# Patient Record
Sex: Female | Born: 1935 | Race: Black or African American | Hispanic: No | Marital: Married | Smoking: Never smoker
Health system: Southern US, Community
[De-identification: ages and names within clinical notes are randomized; demographics above are authoritative.]

## PROBLEM LIST (undated history)

## (undated) DIAGNOSIS — E785 Hyperlipidemia, unspecified: Secondary | ICD-10-CM

## (undated) DIAGNOSIS — K219 Gastro-esophageal reflux disease without esophagitis: Secondary | ICD-10-CM

## (undated) DIAGNOSIS — I10 Essential (primary) hypertension: Secondary | ICD-10-CM

## (undated) DIAGNOSIS — M1990 Unspecified osteoarthritis, unspecified site: Secondary | ICD-10-CM

## (undated) DIAGNOSIS — G4730 Sleep apnea, unspecified: Secondary | ICD-10-CM

## (undated) HISTORY — PX: TOTAL KNEE ARTHROPLASTY: SHX125

## (undated) HISTORY — PX: TONSILLECTOMY: SUR1361

## (undated) HISTORY — PX: EYE SURGERY: SHX253

## (undated) HISTORY — DX: Unspecified osteoarthritis, unspecified site: M19.90

## (undated) HISTORY — DX: Hyperlipidemia, unspecified: E78.5

## (undated) HISTORY — DX: Essential (primary) hypertension: I10

---

## 1999-01-30 ENCOUNTER — Other Ambulatory Visit: Admission: RE | Admit: 1999-01-30 | Discharge: 1999-01-30 | Payer: Self-pay | Admitting: Family Medicine

## 1999-03-21 ENCOUNTER — Encounter: Payer: Self-pay | Admitting: Family Medicine

## 1999-03-21 ENCOUNTER — Ambulatory Visit (HOSPITAL_COMMUNITY): Admission: RE | Admit: 1999-03-21 | Discharge: 1999-03-21 | Payer: Self-pay | Admitting: Family Medicine

## 1999-04-17 ENCOUNTER — Ambulatory Visit (HOSPITAL_COMMUNITY): Admission: RE | Admit: 1999-04-17 | Discharge: 1999-04-17 | Payer: Self-pay | Admitting: Gastroenterology

## 1999-04-18 ENCOUNTER — Ambulatory Visit (HOSPITAL_COMMUNITY): Admission: RE | Admit: 1999-04-18 | Discharge: 1999-04-18 | Payer: Self-pay | Admitting: Gastroenterology

## 2000-04-03 ENCOUNTER — Other Ambulatory Visit: Admission: RE | Admit: 2000-04-03 | Discharge: 2000-04-03 | Payer: Self-pay | Admitting: Family Medicine

## 2000-08-06 ENCOUNTER — Ambulatory Visit (HOSPITAL_COMMUNITY): Admission: RE | Admit: 2000-08-06 | Discharge: 2000-08-06 | Payer: Self-pay | Admitting: Gastroenterology

## 2001-03-06 ENCOUNTER — Encounter: Payer: Self-pay | Admitting: Family Medicine

## 2001-03-06 ENCOUNTER — Ambulatory Visit (HOSPITAL_COMMUNITY): Admission: RE | Admit: 2001-03-06 | Discharge: 2001-03-06 | Payer: Self-pay | Admitting: Family Medicine

## 2001-04-23 ENCOUNTER — Encounter: Payer: Self-pay | Admitting: Family Medicine

## 2001-04-23 ENCOUNTER — Ambulatory Visit (HOSPITAL_COMMUNITY): Admission: RE | Admit: 2001-04-23 | Discharge: 2001-04-23 | Payer: Self-pay | Admitting: Family Medicine

## 2002-03-11 ENCOUNTER — Ambulatory Visit (HOSPITAL_COMMUNITY): Admission: RE | Admit: 2002-03-11 | Discharge: 2002-03-11 | Payer: Self-pay | Admitting: Family Medicine

## 2002-03-11 ENCOUNTER — Encounter: Payer: Self-pay | Admitting: Family Medicine

## 2002-07-14 ENCOUNTER — Other Ambulatory Visit: Admission: RE | Admit: 2002-07-14 | Discharge: 2002-07-14 | Payer: Self-pay | Admitting: Family Medicine

## 2003-08-17 ENCOUNTER — Ambulatory Visit (HOSPITAL_COMMUNITY): Admission: RE | Admit: 2003-08-17 | Discharge: 2003-08-17 | Payer: Self-pay | Admitting: Family Medicine

## 2003-08-17 ENCOUNTER — Encounter: Payer: Self-pay | Admitting: Family Medicine

## 2003-08-17 ENCOUNTER — Other Ambulatory Visit: Admission: RE | Admit: 2003-08-17 | Discharge: 2003-08-17 | Payer: Self-pay | Admitting: Family Medicine

## 2003-08-23 ENCOUNTER — Encounter: Payer: Self-pay | Admitting: Family Medicine

## 2003-08-23 ENCOUNTER — Ambulatory Visit (HOSPITAL_COMMUNITY): Admission: RE | Admit: 2003-08-23 | Discharge: 2003-08-23 | Payer: Self-pay | Admitting: Family Medicine

## 2004-06-14 ENCOUNTER — Ambulatory Visit (HOSPITAL_COMMUNITY): Admission: RE | Admit: 2004-06-14 | Discharge: 2004-06-14 | Payer: Self-pay | Admitting: Family Medicine

## 2004-06-18 ENCOUNTER — Ambulatory Visit (HOSPITAL_COMMUNITY): Admission: RE | Admit: 2004-06-18 | Discharge: 2004-06-18 | Payer: Self-pay | Admitting: Orthopedic Surgery

## 2004-07-04 ENCOUNTER — Ambulatory Visit (HOSPITAL_COMMUNITY): Admission: RE | Admit: 2004-07-04 | Discharge: 2004-07-04 | Payer: Self-pay | Admitting: Orthopedic Surgery

## 2004-07-04 ENCOUNTER — Ambulatory Visit (HOSPITAL_BASED_OUTPATIENT_CLINIC_OR_DEPARTMENT_OTHER): Admission: RE | Admit: 2004-07-04 | Discharge: 2004-07-04 | Payer: Self-pay | Admitting: Orthopedic Surgery

## 2004-07-04 ENCOUNTER — Encounter (INDEPENDENT_AMBULATORY_CARE_PROVIDER_SITE_OTHER): Payer: Self-pay | Admitting: *Deleted

## 2004-08-31 ENCOUNTER — Ambulatory Visit (HOSPITAL_COMMUNITY): Admission: RE | Admit: 2004-08-31 | Discharge: 2004-08-31 | Payer: Self-pay | Admitting: Family Medicine

## 2005-07-16 ENCOUNTER — Other Ambulatory Visit: Admission: RE | Admit: 2005-07-16 | Discharge: 2005-07-16 | Payer: Self-pay | Admitting: Family Medicine

## 2007-01-27 ENCOUNTER — Ambulatory Visit (HOSPITAL_COMMUNITY): Admission: RE | Admit: 2007-01-27 | Discharge: 2007-01-27 | Payer: Self-pay | Admitting: Family Medicine

## 2007-05-15 ENCOUNTER — Ambulatory Visit (HOSPITAL_COMMUNITY): Admission: RE | Admit: 2007-05-15 | Discharge: 2007-05-15 | Payer: Self-pay | Admitting: Family Medicine

## 2007-05-27 ENCOUNTER — Ambulatory Visit (HOSPITAL_COMMUNITY): Admission: RE | Admit: 2007-05-27 | Discharge: 2007-05-27 | Payer: Self-pay | Admitting: Orthopedic Surgery

## 2007-05-27 ENCOUNTER — Ambulatory Visit (HOSPITAL_COMMUNITY): Admission: RE | Admit: 2007-05-27 | Discharge: 2007-05-27 | Payer: Self-pay | Admitting: Family Medicine

## 2007-05-29 ENCOUNTER — Ambulatory Visit (HOSPITAL_COMMUNITY): Admission: RE | Admit: 2007-05-29 | Discharge: 2007-05-29 | Payer: Self-pay | Admitting: Family Medicine

## 2008-06-23 ENCOUNTER — Ambulatory Visit (HOSPITAL_COMMUNITY): Admission: RE | Admit: 2008-06-23 | Discharge: 2008-06-23 | Payer: Self-pay | Admitting: Family Medicine

## 2009-02-06 ENCOUNTER — Encounter: Admission: RE | Admit: 2009-02-06 | Discharge: 2009-02-06 | Payer: Self-pay | Admitting: Orthopedic Surgery

## 2009-06-29 ENCOUNTER — Ambulatory Visit (HOSPITAL_COMMUNITY): Admission: RE | Admit: 2009-06-29 | Discharge: 2009-06-29 | Payer: Self-pay | Admitting: Family Medicine

## 2009-07-04 ENCOUNTER — Other Ambulatory Visit: Admission: RE | Admit: 2009-07-04 | Discharge: 2009-07-04 | Payer: Self-pay | Admitting: Family Medicine

## 2009-10-17 ENCOUNTER — Encounter: Admission: RE | Admit: 2009-10-17 | Discharge: 2009-10-17 | Payer: Self-pay | Admitting: Orthopedic Surgery

## 2010-01-23 ENCOUNTER — Inpatient Hospital Stay (HOSPITAL_COMMUNITY): Admission: RE | Admit: 2010-01-23 | Discharge: 2010-01-27 | Payer: Self-pay | Admitting: Orthopedic Surgery

## 2010-02-20 ENCOUNTER — Encounter (HOSPITAL_COMMUNITY): Admission: RE | Admit: 2010-02-20 | Discharge: 2010-03-22 | Payer: Self-pay | Admitting: Orthopedic Surgery

## 2010-03-28 ENCOUNTER — Encounter: Admission: RE | Admit: 2010-03-28 | Discharge: 2010-03-28 | Payer: Self-pay | Admitting: Orthopedic Surgery

## 2010-06-08 ENCOUNTER — Ambulatory Visit (HOSPITAL_COMMUNITY): Admission: RE | Admit: 2010-06-08 | Discharge: 2010-06-08 | Payer: Self-pay | Admitting: Family Medicine

## 2010-06-15 ENCOUNTER — Ambulatory Visit (HOSPITAL_COMMUNITY): Admission: RE | Admit: 2010-06-15 | Discharge: 2010-06-15 | Payer: Self-pay | Admitting: Family Medicine

## 2010-07-18 ENCOUNTER — Ambulatory Visit: Admission: RE | Admit: 2010-07-18 | Discharge: 2010-07-18 | Payer: Self-pay | Admitting: Gynecologic Oncology

## 2010-07-31 ENCOUNTER — Inpatient Hospital Stay (HOSPITAL_COMMUNITY): Admission: RE | Admit: 2010-07-31 | Discharge: 2010-08-01 | Payer: Self-pay | Admitting: Obstetrics & Gynecology

## 2010-07-31 ENCOUNTER — Encounter: Payer: Self-pay | Admitting: Obstetrics & Gynecology

## 2010-07-31 HISTORY — PX: ABDOMINAL HYSTERECTOMY: SHX81

## 2010-09-06 ENCOUNTER — Ambulatory Visit: Admission: RE | Admit: 2010-09-06 | Discharge: 2010-09-06 | Payer: Self-pay | Admitting: Gynecologic Oncology

## 2010-09-17 ENCOUNTER — Ambulatory Visit (HOSPITAL_COMMUNITY): Admission: RE | Admit: 2010-09-17 | Discharge: 2010-09-17 | Payer: Self-pay | Admitting: *Deleted

## 2010-12-09 ENCOUNTER — Encounter: Payer: Self-pay | Admitting: Family Medicine

## 2011-01-23 ENCOUNTER — Ambulatory Visit
Admission: RE | Admit: 2011-01-23 | Discharge: 2011-01-23 | Disposition: A | Discharge status: Home / Self Care | Payer: Medicare Other | Source: Ambulatory Visit | Attending: Orthopedic Surgery | Admitting: Orthopedic Surgery

## 2011-01-23 ENCOUNTER — Other Ambulatory Visit: Payer: Self-pay | Admitting: Orthopedic Surgery

## 2011-01-23 DIAGNOSIS — R52 Pain, unspecified: Secondary | ICD-10-CM

## 2011-01-31 LAB — COMPREHENSIVE METABOLIC PANEL
AST: 19 U/L (ref 0–37)
Albumin: 3.8 g/dL (ref 3.5–5.2)
Calcium: 9.5 mg/dL (ref 8.4–10.5)
Creatinine, Ser: 0.99 mg/dL (ref 0.4–1.2)
GFR calc Af Amer: >60 mL/min (ref >60)
Total Protein: 7 g/dL (ref 6.0–8.3)

## 2011-01-31 LAB — CBC
MCH: 33.1 pg (ref 26.0–34.0)
MCH: 33.2 pg (ref 26.0–34.0)
MCHC: 34.5 g/dL (ref 30.0–36.0)
Platelets: 179 10*3/uL (ref 150–400)
Platelets: 197 10*3/uL (ref 150–400)
RBC: 3.66 MIL/uL — ABNORMAL LOW (ref 3.87–5.11)

## 2011-01-31 LAB — DIFFERENTIAL
Eosinophils Relative: 0 % (ref 0–5)
Lymphocytes Relative: 18 % (ref 12–46)
Lymphs Abs: 1.4 10*3/uL (ref 0.7–4.0)
Monocytes Absolute: 0.5 10*3/uL (ref 0.1–1.0)

## 2011-01-31 LAB — SURGICAL PCR SCREEN: Staphylococcus aureus: NEGATIVE

## 2011-01-31 LAB — ABO/RH: ABO/RH(D): A POS

## 2011-01-31 LAB — TYPE AND SCREEN: ABO/RH(D): A POS

## 2011-02-10 LAB — URINALYSIS, ROUTINE W REFLEX MICROSCOPIC
Glucose, UA: NEGATIVE mg/dL
Hgb urine dipstick: NEGATIVE
Specific Gravity, Urine: 1.013 (ref 1.005–1.030)
pH: 6 (ref 5.0–8.0)

## 2011-02-10 LAB — CBC
HCT: 30 % — ABNORMAL LOW (ref 36.0–46.0)
HCT: 42.1 % (ref 36.0–46.0)
MCHC: 34.3 g/dL (ref 30.0–36.0)
MCHC: 34.6 g/dL (ref 30.0–36.0)
MCV: 96.1 fL (ref 78.0–100.0)
Platelets: 104 10*3/uL — ABNORMAL LOW (ref 150–400)
Platelets: 215 10*3/uL (ref 150–400)
RDW: 13.3 % (ref 11.5–15.5)
RDW: 13.6 % (ref 11.5–15.5)
WBC: 14.8 10*3/uL — ABNORMAL HIGH (ref 4.0–10.5)

## 2011-02-10 LAB — COMPREHENSIVE METABOLIC PANEL
ALT: 23 U/L (ref 0–35)
AST: 20 U/L (ref 0–37)
Albumin: 3.9 g/dL (ref 3.5–5.2)
Alkaline Phosphatase: 49 U/L (ref 39–117)
BUN: 23 mg/dL (ref 6–23)
Chloride: 105 mEq/L (ref 96–112)
GFR calc Af Amer: >60 mL/min (ref >60)
Potassium: 3.3 mEq/L — ABNORMAL LOW (ref 3.5–5.1)
Sodium: 147 mEq/L — ABNORMAL HIGH (ref 135–145)
Total Bilirubin: 1.7 mg/dL — ABNORMAL HIGH (ref 0.3–1.2)

## 2011-02-10 LAB — URINE MICROSCOPIC-ADD ON

## 2011-02-10 LAB — PROTIME-INR
INR: 1.41 (ref 0.00–1.49)
INR: 1.53 — ABNORMAL HIGH (ref 0.00–1.49)
INR: 1.61 — ABNORMAL HIGH (ref 0.00–1.49)
Prothrombin Time: 17.1 seconds — ABNORMAL HIGH (ref 11.6–15.2)
Prothrombin Time: 17.9 seconds — ABNORMAL HIGH (ref 11.6–15.2)

## 2011-02-10 LAB — TYPE AND SCREEN: Antibody Screen: NEGATIVE

## 2011-02-15 ENCOUNTER — Encounter (HOSPITAL_COMMUNITY)
Admission: RE | Admit: 2011-02-15 | Discharge: 2011-02-15 | Disposition: A | Discharge status: Home / Self Care | Payer: Medicare Other | Source: Ambulatory Visit | Attending: Orthopedic Surgery | Admitting: Orthopedic Surgery

## 2011-02-15 ENCOUNTER — Other Ambulatory Visit (HOSPITAL_BASED_OUTPATIENT_CLINIC_OR_DEPARTMENT_OTHER): Payer: Self-pay | Admitting: Orthopedic Surgery

## 2011-02-15 DIAGNOSIS — M1990 Unspecified osteoarthritis, unspecified site: Secondary | ICD-10-CM

## 2011-02-15 LAB — URINALYSIS, ROUTINE W REFLEX MICROSCOPIC
Glucose, UA: NEGATIVE mg/dL
Ketones, ur: NEGATIVE mg/dL
Nitrite: NEGATIVE
Protein, ur: NEGATIVE mg/dL
Urobilinogen, UA: 0.2 mg/dL (ref 0.0–1.0)

## 2011-02-15 LAB — COMPREHENSIVE METABOLIC PANEL
AST: 24 U/L (ref 0–37)
BUN: 15 mg/dL (ref 6–23)
CO2: 29 mEq/L (ref 19–32)
Calcium: 10.4 mg/dL (ref 8.4–10.5)
Chloride: 105 mEq/L (ref 96–112)
Creatinine, Ser: 1.04 mg/dL (ref 0.4–1.2)
GFR calc non Af Amer: 52 mL/min — ABNORMAL LOW (ref >60)
Glucose, Bld: 90 mg/dL (ref 70–99)
Total Bilirubin: 1.5 mg/dL — ABNORMAL HIGH (ref 0.3–1.2)

## 2011-02-15 LAB — SURGICAL PCR SCREEN
MRSA, PCR: NEGATIVE
Staphylococcus aureus: NEGATIVE

## 2011-02-15 LAB — CBC
HCT: 40.3 % (ref 36.0–46.0)
Hemoglobin: 13.7 g/dL (ref 12.0–15.0)
MCV: 93.3 fL (ref 78.0–100.0)
RDW: 13.5 % (ref 11.5–15.5)
WBC: 12.3 10*3/uL — ABNORMAL HIGH (ref 4.0–10.5)

## 2011-02-15 LAB — URINE MICROSCOPIC-ADD ON

## 2011-02-21 ENCOUNTER — Inpatient Hospital Stay (HOSPITAL_COMMUNITY)
Admission: RE | Admit: 2011-02-21 | Discharge: 2011-02-24 | DRG: 470 | Disposition: A | Discharge status: Home / Self Care | Payer: Medicare Other | Source: Ambulatory Visit | Attending: Orthopedic Surgery | Admitting: Orthopedic Surgery

## 2011-02-21 DIAGNOSIS — I10 Essential (primary) hypertension: Secondary | ICD-10-CM | POA: Diagnosis present

## 2011-02-21 DIAGNOSIS — M1710 Unilateral primary osteoarthritis, unspecified knee: Principal | ICD-10-CM | POA: Diagnosis present

## 2011-02-21 LAB — APTT: aPTT: 35 seconds (ref 24–37)

## 2011-02-21 LAB — URINALYSIS, ROUTINE W REFLEX MICROSCOPIC
Glucose, UA: NEGATIVE mg/dL
Hgb urine dipstick: NEGATIVE
Protein, ur: NEGATIVE mg/dL
Urobilinogen, UA: 0.2 mg/dL (ref 0.0–1.0)

## 2011-02-21 LAB — PROTIME-INR: Prothrombin Time: 14 seconds (ref 11.6–15.2)

## 2011-02-21 LAB — TYPE AND SCREEN
ABO/RH(D): A POS
Antibody Screen: NEGATIVE

## 2011-02-22 LAB — PROTIME-INR: Prothrombin Time: 15.9 seconds — ABNORMAL HIGH (ref 11.6–15.2)

## 2011-02-22 LAB — CBC
Hemoglobin: 9.9 g/dL — ABNORMAL LOW (ref 12.0–15.0)
MCV: 92.4 fL (ref 78.0–100.0)
Platelets: 153 10*3/uL (ref 150–400)
RBC: 3.14 MIL/uL — ABNORMAL LOW (ref 3.87–5.11)
WBC: 10.8 10*3/uL — ABNORMAL HIGH (ref 4.0–10.5)

## 2011-02-23 LAB — CBC
HCT: 26.6 % — ABNORMAL LOW (ref 36.0–46.0)
MCH: 31.5 pg (ref 26.0–34.0)
MCHC: 34.2 g/dL (ref 30.0–36.0)
MCV: 92 fL (ref 78.0–100.0)
RDW: 13.6 % (ref 11.5–15.5)

## 2011-02-23 LAB — PROTIME-INR: INR: 1.54 — ABNORMAL HIGH (ref 0.00–1.49)

## 2011-04-04 ENCOUNTER — Ambulatory Visit (HOSPITAL_COMMUNITY)
Admission: RE | Admit: 2011-04-04 | Discharge: 2011-04-04 | Disposition: A | Discharge status: Home / Self Care | Payer: Medicare Other | Source: Ambulatory Visit | Attending: Orthopedic Surgery | Admitting: Orthopedic Surgery

## 2011-04-04 DIAGNOSIS — M25569 Pain in unspecified knee: Secondary | ICD-10-CM | POA: Insufficient documentation

## 2011-04-04 DIAGNOSIS — R262 Difficulty in walking, not elsewhere classified: Secondary | ICD-10-CM | POA: Insufficient documentation

## 2011-04-04 DIAGNOSIS — I10 Essential (primary) hypertension: Secondary | ICD-10-CM | POA: Insufficient documentation

## 2011-04-04 DIAGNOSIS — IMO0001 Reserved for inherently not codable concepts without codable children: Secondary | ICD-10-CM | POA: Insufficient documentation

## 2011-04-04 DIAGNOSIS — M6281 Muscle weakness (generalized): Secondary | ICD-10-CM | POA: Insufficient documentation

## 2011-04-04 DIAGNOSIS — M25669 Stiffness of unspecified knee, not elsewhere classified: Secondary | ICD-10-CM | POA: Insufficient documentation

## 2011-04-04 NOTE — H&P (Signed)
  NAMELUCKY, [name]            ACCOUNT NO.:  [id number]  MEDICAL RECORD NO.:  [id number]           PATIENT TYPE:  I  LOCATION:  5010                         FACILITY:  [facility]  PHYSICIAN:  Myrtie Neither, MD      DATE OF BIRTH:  May 25, 1936  DATE OF ADMISSION:  02/21/2011 DATE OF DISCHARGE:                             HISTORY & PHYSICAL   CHIEF COMPLAINT:  Painful right knee.  HISTORY OF PRESENT ILLNESS:  This is a 75 year old female followed in the office for degenerative joint disease, involving bilateral knees. The patient had left total knee arthroplasty a year ago and returns presently due to persistent pain, slippage and catching of the right knee.  The patient ambulates with use of cane.  PAST MEDICAL HISTORY:  Degenerative joint disease, hypertension.  ALLERGIES:  Shellfish.  MEDICATIONS: 1. Dexamethasone 4 mg p.r.n. 2. Skelaxin 800 mg q.8 p.r.n. 3. Vitamin D3 over the counter 1 tab daily. 4. Amlodipine 10 mg 1 daily. 5. Etodolac 400 mg b.i.d. 6. Fish oil 1000 mg over the counter twice daily. 7. Multivitamins 1 b.i.d. 8. KCl 20 mEq 1 daily. 9. Triamterene/hydrochlorothiazide 37.5/25 daily. 10.Vitamin B complex daily. 11.Vitamin C 500 mg daily.  SOCIAL HISTORY:  Negative for use of alcohol and tobacco and illegal drugs.  FAMILY HISTORY:  Hypertension.Marland Kitchen  REVIEW OF SYSTEMS:  No cardiac, respiratory, urinary, or bowel symptoms.  PAST SURGERIES:  Left total knee arthroplasty.  The patient had recent cardiac evaluation which was found to be low risk for surgery.  PHYSICAL EXAMINATION:  GENERAL:  Alert and oriented, in no acute distress. VITAL SIGNS:  Temperature 97.7, pulse 62, respirations 18, blood pressure 117/72, O2 saturation 96%. HEENT:  Head normocephalic.  Eyes, conjunctivae and sclerae clear. NECK:  Supple. CHEST:  Clear. CARDIAC:  S1 and S2 regular. EXTREMITIES:  Right knee genu valgum, crepitus medial and lateral compartment and patellofemoral  joint.  Range of motion good but limited. Negative Homans test, +2 effusion.  X-ray reveals loss of joint space, osteophytes, and sclerosis, right knee.  IMPRESSION: 1. Degenerative joint disease, right knee. 2. History of hypertension.  PLAN:  Right total knee arthroplasty.     Myrtie Neither, MD     AC/MEDQ  D:  02/21/2011  T:  02/22/2011  Job:  [id number]  Electronically Signed by Myrtie Neither MD on 04/04/2011 03:06:53 PM

## 2011-04-04 NOTE — Discharge Summary (Signed)
  NAMERICKY, DOAN            ACCOUNT NO.:  [id number]  MEDICAL RECORD NO.:  [id number]           PATIENT TYPE:  I  LOCATION:  5010                         FACILITY:  [facility]  PHYSICIAN:  Myrtie Neither, MD      DATE OF BIRTH:  1936/03/05  DATE OF ADMISSION:  02/21/2011 DATE OF DISCHARGE:  02/24/2011                              DISCHARGE SUMMARY   ADMITTING DIAGNOSIS:  Degenerative joint disease, right knee.  DISCHARGE DIAGNOSIS:  Degenerative joint disease, right knee.  COMPLICATIONS:  None.  INFECTIONS:  None.  OPERATION:  Right total knee arthroplasty.  PERTINENT HISTORY:  This is a 75 year old female followed in the office for degenerative joint disease involving both knees over the past few years.  The patient had previous left total knee arthroplasty a year ago and returned presently for the right knee.  The patient had been treated with anti-inflammatories, use a cane, and pain medication.  Pertinent physical of the right knee, range of motion good but limited, crepitus medial and lateral compartment, patellofemoral crepitus, +2 effusion, negative Homans' test.  X-rays revealed loss of joint space with sclerosis and osteophytes.  HOSPITAL COURSE:  The patient underwent preop laboratory, CBC, EKG, PT/PTT, platelet count, CMET, UA.  The patient's labs were found to be stable enough to undergo surgery.  The patient underwent right total knee arthroplasty.  Postop course was fairly benign.  The patient started on physical therapy, postop CPM, and postop antibiotics as well as anticoagulation.  The patient progressed quite well with partial weightbearing on the right side with use of walker.  Wound is healing quite well.  The patient has been stable.  The patient is stable enough presently to be discharged and will be discharged to continue her present medications with exception of Lodine.  NEW MEDICATIONS:  The patient has been placed on Percocet one to two q. 6 h.  p.r.n. for pain.  She is to take her muscle relaxant that she has at home p.r.n., Coumadin 5 mg daily, and to return to the office in 1 week.  The patient is being discharged in stable and satisfactory condition.     Myrtie Neither, MD    AC/MEDQ  D:  02/24/2011  T:  02/25/2011  Job:  [id number]  Electronically Signed by Myrtie Neither MD on 04/04/2011 03:06:56 PM

## 2011-04-04 NOTE — Op Note (Signed)
[name], [name]            ACCOUNT NO.:  [id number]  MEDICAL RECORD NO.:  [id number]           PATIENT TYPE:  I  LOCATION:  5010                         FACILITY:  [facility]  PHYSICIAN:  Myrtie Neither, MD      DATE OF BIRTH:  1936/03/28  DATE OF PROCEDURE:  02/21/2011 DATE OF DISCHARGE:                              OPERATIVE REPORT   PREOPERATIVE DIAGNOSIS:  Degenerative joint disease, right knee.  POSTOPERATIVE DIAGNOSIS:  Degenerative joint disease, right knee.  ANESTHESIA:  General.  PROCEDURE:  Right total knee arthroplasty, Biomet implant.  The patient was taken to the operating room after given adequate preop medications, given general anesthesia and intubated, right lower extremity was prepped with DuraPrep and draped in a sterile manner. Tourniquet and Bovie were used for hemostasis.  Anterior midline incision was made over the right knee going through skin and subcutaneous tissue, going down to the capsule.  A medial paramedian incision was made into the capsule.  Patella was reflected laterally. Osteophytes about the femur, patella, and tibia were resected.  Soft tissue resection was then done.  Joint alignment was obtained by minimal soft tissue release.  With the knee in a flexed position, tibial cutting jig was put at 10 mm and tibial plateau surface was resected.  Reaming down the femoral canal was then done followed by application of distal femoral cutting jig at 3 degrees.  Distal femoral cut was done followed by sizing of the femur which was 62.5.  Appropriate cutting jig was put in place.  Anterior, posterior, and chamfer cuts were then made and the soft tissue resection was done.  Trial femoral component was found to fit very snug.  Attention was then turned to the tibia.  Tibial surface was sized at 71 mm.  Appropriate cutting jig was put in place and appropriate cut was made for the tibial surface.  A 10-mm trial was put in place along with the trial  femoral component, full extension, full flexion, good medial and lateral stability.  Next, sizing of the patella was that of 34 mm and appropriate cutting jig was put in place.  Patella surface was prepared.  Medium size 34-mm patella button was put in place.  Range of motion again was tested with all 3 trial components, full extension, full flexion, good medial and lateral stability.  No subluxing of the patella was noted.  Trial components were then removed. Irrigation with some pulse irrigator was then done.  Methylmethacrylate was then mixed, patella and tibial components were cemented.  Femoral component was press fitted after excess methylmethacrylate was removed. Again, a 10-mm poly was again tested with full extension, full flexion, good medial and lateral stability, and no subluxing of the patella. Final 10-mm tibial bearing surface was put in place and locked with a key.  Irrigation was then done.  Tourniquet was let down.  Hemostasis was obtained.  Wound closure was then done with 0 Vicryl for the fascia, 2-0 for subcutaneous, and skin staples for the skin.  Compressive dressing was applied.  Knee immobilizer was applied. The patient tolerated the procedure quite well and taken to the recovery  room in stable and satisfactory condition.     Myrtie Neither, MD     AC/MEDQ  D:  02/21/2011  T:  02/22/2011  Job:  [id number]  Electronically Signed by Myrtie Neither MD on 04/04/2011 03:06:49 PM

## 2011-04-05 NOTE — Procedures (Signed)
[facility]  Patient:    [name], [name]                      MRN: [id number] Proc. Date: 08/06/00 Adm. Date:  [id number] Attending:  Charna Elizabeth CC:         Geraldo Pitter, M.D.   Procedure Report  DATE OF BIRTH:  02-24-1936  REFERRING PHYSICIAN:  Geraldo Pitter, M.D.  PROCEDURE PERFORMED:  Colonoscopy.  ENDOSCOPIST:  Anselmo Rod, M.D.  INSTRUMENT USED:  Olympus video colonoscope.  INDICATIONS FOR PROCEDURE:  Personal history of polyps in a 75 year old black female rule out recurrent polyps.  PREPROCEDURE PREPARATION:  Informed consent was procured from the patient. The patient was fasted for eight hours prior to the procedure and prepped with a bottle of magnesium citrate and a gallon of NuLytely the night prior to the procedure.  PREPROCEDURE PHYSICAL:  The patient had stable vital signs.  Neck supple. Chest clear to auscultation.  S1, S2 regular.  No murmur, rub or gallop.  No rales, rhonchi or wheezing.  Abdomen soft with normal abdominal bowel sounds.  DESCRIPTION OF PROCEDURE:  The patient was placed in the left lateral decubitus position and sedated with 50 mg of Demerol and 5 mg of Versed intravenously.  Once the patient was adequately sedated and maintained on low-flow oxygen and continuous cardiac monitoring, the Olympus video colonoscope was advanced from the rectum to the cecum without difficult.  No masses, polyps, erosions, ulcerations or diverticula were seen.  The patient had normal-appearing colon and tolerated the procedure well without complication.  IMPRESSION:  Normal colonoscopy.  RECOMMENDATIONS:  Repeat colonoscopy was recommended in the next 5 to 10 years unless the patient were to develop any symptoms in the interim.DD:  08/06/00 TD:  08/07/00 Job: [id number]

## 2011-04-05 NOTE — Op Note (Signed)
NAME:  [name], [name]                      ACCOUNT NO.:  [id number]   MEDICAL RECORD NO.:  [id number]                   PATIENT TYPE:  AMB   LOCATION:  DSC                                  FACILITY:  [facility]   PHYSICIAN:  [name], M.D.                    DATE OF BIRTH:  [birth year]   DATE OF PROCEDURE:  07/04/2004  DATE OF DISCHARGE:                                 OPERATIVE REPORT   PREOPERATIVE DIAGNOSIS:  Mass, right thumb.   POSTOPERATIVE DIAGNOSIS:  Mass, right thumb.   OPERATION PERFORMED:  Excision, mass, right thumb.   SURGEON:  [name], M.D.   ASSISTANTCarolyne Fiscal.   ANESTHESIA:  Forearm based IV regional.   INDICATIONS FOR PROCEDURE:  The patient is a 75 year old female with a  history of a mass on the dorsal aspect just proximal to the  metacarpophalangeal joint of the right thumb.   DESCRIPTION OF PROCEDURE:  The patient was brought to the operating room  where forearm based IV regional anesthetic was carried out without  difficulty.  She was prepped using DuraPrep in supine position, right arm  free.  A linear incision was made directly over the mass and carried down  through subcutaneous tissue.  A well-circumscribed, encapsulated lipoma was  immediately apparent.  With blunt and sharp dissection this was dissected  free.  Bleeders were electrocauterized.  Neurovascular structures otherwise  protected.  The specimen was sent to pathology.  The wound was irrigated.  The skin was closed with interrupted 5-0 nylon suture.  Sterile compressive  dressing and was applied.  The patient tolerated the procedure well and was  taken to the recovery room for observation in satisfactory condition.  She  is discharged to home to return to the [facility] [place] in one week  on Vicodin.                                               [name], M.D.    GK/MEDQ  D:  07/04/2004  T:  07/04/2004  Job:  [id number]

## 2011-04-10 ENCOUNTER — Ambulatory Visit (HOSPITAL_COMMUNITY)
Admission: RE | Admit: 2011-04-10 | Discharge: 2011-04-10 | Disposition: A | Discharge status: Home / Self Care | Payer: Medicare Other | Source: Ambulatory Visit | Attending: Family Medicine | Admitting: Family Medicine

## 2011-04-16 ENCOUNTER — Ambulatory Visit (HOSPITAL_COMMUNITY)
Admission: RE | Admit: 2011-04-16 | Discharge: 2011-04-16 | Disposition: A | Discharge status: Home / Self Care | Payer: Medicare Other | Source: Ambulatory Visit | Attending: Family Medicine | Admitting: Family Medicine

## 2011-04-17 ENCOUNTER — Ambulatory Visit (HOSPITAL_COMMUNITY)
Admission: RE | Admit: 2011-04-17 | Discharge: 2011-04-17 | Disposition: A | Discharge status: Home / Self Care | Payer: Medicare Other | Source: Ambulatory Visit | Attending: Family Medicine | Admitting: Family Medicine

## 2011-04-19 ENCOUNTER — Ambulatory Visit (HOSPITAL_COMMUNITY)
Admission: RE | Admit: 2011-04-19 | Discharge: 2011-04-19 | Disposition: A | Discharge status: Home / Self Care | Payer: Medicare Other | Source: Ambulatory Visit | Attending: Family Medicine | Admitting: Family Medicine

## 2011-04-19 DIAGNOSIS — IMO0001 Reserved for inherently not codable concepts without codable children: Secondary | ICD-10-CM | POA: Insufficient documentation

## 2011-04-19 DIAGNOSIS — R262 Difficulty in walking, not elsewhere classified: Secondary | ICD-10-CM | POA: Insufficient documentation

## 2011-04-19 DIAGNOSIS — M25669 Stiffness of unspecified knee, not elsewhere classified: Secondary | ICD-10-CM | POA: Insufficient documentation

## 2011-04-19 DIAGNOSIS — M25569 Pain in unspecified knee: Secondary | ICD-10-CM | POA: Insufficient documentation

## 2011-04-19 DIAGNOSIS — M6281 Muscle weakness (generalized): Secondary | ICD-10-CM | POA: Insufficient documentation

## 2011-04-19 DIAGNOSIS — I10 Essential (primary) hypertension: Secondary | ICD-10-CM | POA: Insufficient documentation

## 2011-04-22 ENCOUNTER — Ambulatory Visit (HOSPITAL_COMMUNITY)
Admission: RE | Admit: 2011-04-22 | Discharge: 2011-04-22 | Disposition: A | Discharge status: Home / Self Care | Payer: Medicare Other | Source: Ambulatory Visit | Attending: Family Medicine | Admitting: Family Medicine

## 2011-04-24 ENCOUNTER — Ambulatory Visit (HOSPITAL_COMMUNITY): Payer: Medicare Other | Admitting: *Deleted

## 2011-04-26 ENCOUNTER — Ambulatory Visit (HOSPITAL_COMMUNITY): Payer: Medicare Other | Admitting: Physical Therapy

## 2011-06-11 ENCOUNTER — Ambulatory Visit
Admission: RE | Admit: 2011-06-11 | Discharge: 2011-06-11 | Disposition: A | Discharge status: Home / Self Care | Payer: Medicare Other | Source: Ambulatory Visit | Attending: Orthopedic Surgery | Admitting: Orthopedic Surgery

## 2011-06-11 ENCOUNTER — Other Ambulatory Visit: Payer: Self-pay | Admitting: Orthopedic Surgery

## 2011-06-11 DIAGNOSIS — R52 Pain, unspecified: Secondary | ICD-10-CM

## 2011-10-11 ENCOUNTER — Other Ambulatory Visit (HOSPITAL_COMMUNITY): Payer: Self-pay | Admitting: Family Medicine

## 2011-10-11 DIAGNOSIS — Z139 Encounter for screening, unspecified: Secondary | ICD-10-CM

## 2011-10-17 ENCOUNTER — Ambulatory Visit (HOSPITAL_COMMUNITY)
Admission: RE | Admit: 2011-10-17 | Discharge: 2011-10-17 | Disposition: A | Discharge status: Home / Self Care | Payer: Medicare Other | Source: Ambulatory Visit | Attending: Family Medicine | Admitting: Family Medicine

## 2011-10-17 DIAGNOSIS — Z1231 Encounter for screening mammogram for malignant neoplasm of breast: Secondary | ICD-10-CM | POA: Insufficient documentation

## 2011-10-17 DIAGNOSIS — Z139 Encounter for screening, unspecified: Secondary | ICD-10-CM

## 2011-10-23 ENCOUNTER — Other Ambulatory Visit: Payer: Self-pay | Admitting: Family Medicine

## 2011-10-23 DIAGNOSIS — R928 Other abnormal and inconclusive findings on diagnostic imaging of breast: Secondary | ICD-10-CM

## 2011-10-30 ENCOUNTER — Other Ambulatory Visit: Payer: Self-pay | Admitting: Family Medicine

## 2011-10-30 ENCOUNTER — Ambulatory Visit (HOSPITAL_COMMUNITY)
Admission: RE | Admit: 2011-10-30 | Discharge: 2011-10-30 | Disposition: A | Discharge status: Home / Self Care | Payer: Medicare Other | Source: Ambulatory Visit | Attending: Family Medicine | Admitting: Family Medicine

## 2011-10-30 DIAGNOSIS — R928 Other abnormal and inconclusive findings on diagnostic imaging of breast: Secondary | ICD-10-CM

## 2011-10-30 DIAGNOSIS — N6009 Solitary cyst of unspecified breast: Secondary | ICD-10-CM | POA: Insufficient documentation

## 2011-11-06 ENCOUNTER — Ambulatory Visit (HOSPITAL_COMMUNITY): Payer: Medicare Other

## 2011-11-27 DIAGNOSIS — M5137 Other intervertebral disc degeneration, lumbosacral region: Secondary | ICD-10-CM | POA: Diagnosis not present

## 2012-02-26 DIAGNOSIS — M5137 Other intervertebral disc degeneration, lumbosacral region: Secondary | ICD-10-CM | POA: Diagnosis not present

## 2012-03-03 ENCOUNTER — Other Ambulatory Visit: Payer: Self-pay | Admitting: Orthopedic Surgery

## 2012-03-03 ENCOUNTER — Ambulatory Visit
Admission: RE | Admit: 2012-03-03 | Discharge: 2012-03-03 | Disposition: A | Discharge status: Home / Self Care | Payer: Medicare Other | Source: Ambulatory Visit | Attending: Orthopedic Surgery | Admitting: Orthopedic Surgery

## 2012-03-03 DIAGNOSIS — M545 Low back pain: Secondary | ICD-10-CM

## 2012-03-03 DIAGNOSIS — M25559 Pain in unspecified hip: Secondary | ICD-10-CM | POA: Diagnosis not present

## 2012-03-03 DIAGNOSIS — M5137 Other intervertebral disc degeneration, lumbosacral region: Secondary | ICD-10-CM | POA: Diagnosis not present

## 2012-03-03 DIAGNOSIS — K8020 Calculus of gallbladder without cholecystitis without obstruction: Secondary | ICD-10-CM | POA: Diagnosis not present

## 2012-03-11 DIAGNOSIS — M5137 Other intervertebral disc degeneration, lumbosacral region: Secondary | ICD-10-CM | POA: Diagnosis not present

## 2012-03-26 DIAGNOSIS — I10 Essential (primary) hypertension: Secondary | ICD-10-CM | POA: Diagnosis not present

## 2012-03-26 DIAGNOSIS — M1250 Traumatic arthropathy, unspecified site: Secondary | ICD-10-CM | POA: Diagnosis not present

## 2012-03-26 DIAGNOSIS — E669 Obesity, unspecified: Secondary | ICD-10-CM | POA: Diagnosis not present

## 2012-03-26 DIAGNOSIS — K828 Other specified diseases of gallbladder: Secondary | ICD-10-CM | POA: Diagnosis not present

## 2012-04-01 ENCOUNTER — Encounter (INDEPENDENT_AMBULATORY_CARE_PROVIDER_SITE_OTHER): Payer: Self-pay

## 2012-04-08 DIAGNOSIS — M5430 Sciatica, unspecified side: Secondary | ICD-10-CM | POA: Diagnosis not present

## 2012-04-14 ENCOUNTER — Encounter (INDEPENDENT_AMBULATORY_CARE_PROVIDER_SITE_OTHER): Payer: Self-pay | Admitting: Surgery

## 2012-04-17 ENCOUNTER — Encounter (INDEPENDENT_AMBULATORY_CARE_PROVIDER_SITE_OTHER): Payer: Self-pay | Admitting: Surgery

## 2012-04-17 ENCOUNTER — Ambulatory Visit (INDEPENDENT_AMBULATORY_CARE_PROVIDER_SITE_OTHER): Payer: Medicare Other | Admitting: Surgery

## 2012-04-17 VITALS — BP 132/76 | HR 78 | Temp 97.4°F | Resp 16 | Ht 67.0 in | Wt 238.0 lb

## 2012-04-17 DIAGNOSIS — K8020 Calculus of gallbladder without cholecystitis without obstruction: Secondary | ICD-10-CM

## 2012-04-17 NOTE — Progress Notes (Signed)
Patient ID: [name], female   DOB: 1936/05/26, 76 y.o.   MRN: [id number]  Chief Complaint  Patient presents with  . Other    gallstones    HPI [name] is a 76 y.o. female.  This is a pleasant female referred to me by Dr. Parke Simmers for evaluation of right upper quadrant abdominal pain and possible symptomatic gallstones. The patient has had multiple attacks of right upper quadrant abdominal pain referring up into her chest and through to her back. She's had nausea but no vomiting. Her last attack was approximately 2 months ago. It occurs after a fatty meal. It is sharp in nature and moderate. She denies jaundice. She is otherwise without complaints HPI  Past Medical History  Diagnosis Date  . Arthritis   . Hyperlipidemia   . Hypertension     Past Surgical History  Procedure Date  . Total knee arthroplasty 01/23/2010/ 02/21/11  . Eye surgery   . Abdominal hysterectomy 07/31/10    Family History  Problem Relation Age of Onset  . Prostate cancer Father     Social History History  Substance Use Topics  . Smoking status: Never Smoker   . Smokeless tobacco: Not on file  . Alcohol Use: No    No Known Allergies  Current Outpatient Prescriptions  Medication Sig Dispense Refill  . amLODipine (NORVASC) 10 MG tablet Take 10 mg by mouth daily.      Marland Kitchen dexamethasone (DECADRON) 4 MG tablet Take 4 mg by mouth 2 (two) times daily with a meal.      . etodolac (LODINE XL) 400 MG 24 hr tablet       . fexofenadine-pseudoephedrine (ALLEGRA-D) 60-120 MG per tablet Take 1 tablet by mouth 2 (two) times daily.      Marland Kitchen KLOR-CON M20 20 MEQ tablet         Review of Systems Review of Systems  Constitutional: Negative for fever, chills and unexpected weight change.  HENT: Negative for hearing loss, congestion, sore throat, trouble swallowing and voice change.   Eyes: Negative for visual disturbance.  Respiratory: Negative for cough and wheezing.   Cardiovascular: Positive for leg  swelling. Negative for chest pain and palpitations.  Gastrointestinal: Positive for nausea and abdominal pain. Negative for vomiting, diarrhea, constipation, blood in stool, abdominal distention and anal bleeding.  Genitourinary: Negative for hematuria, vaginal bleeding and difficulty urinating.  Musculoskeletal: Positive for joint swelling. Negative for arthralgias.  Skin: Negative for rash and wound.  Neurological: Negative for seizures, syncope and headaches.  Hematological: Negative for adenopathy. Does not bruise/bleed easily.  Psychiatric/Behavioral: Negative for confusion.    Blood pressure 132/76, pulse 78, temperature 97.4 F (36.3 C), temperature source Temporal, resp. rate 16, height 5\' 7"  (1.702 m), weight 238 lb (107.956 kg).  Physical Exam Physical Exam  Constitutional: She is oriented to person, place, and time. She appears well-developed and well-nourished. No distress.  HENT:  Head: Normocephalic and atraumatic.  Right Ear: External ear normal.  Left Ear: External ear normal.  Nose: Nose normal.  Mouth/Throat: Oropharynx is clear and moist. No oropharyngeal exudate.  Eyes: Conjunctivae are normal. Pupils are equal, round, and reactive to light. Right eye exhibits no discharge. Left eye exhibits no discharge. No scleral icterus.  Neck: Normal range of motion. Neck supple. No tracheal deviation present. No thyromegaly present.  Cardiovascular: Normal rate, regular rhythm, normal heart sounds and intact distal pulses.   No murmur heard. Pulmonary/Chest: Effort normal and breath sounds normal. No respiratory  distress. She has no wheezes.  Abdominal: Soft. Bowel sounds are normal. She exhibits no distension. There is no tenderness. There is no rebound and no guarding.  Musculoskeletal: She exhibits edema and tenderness.  Lymphadenopathy:    She has no cervical adenopathy.  Neurological: She is alert and oriented to person, place, and time.  Skin: Skin is warm. No rash  noted. She is not diaphoretic. No erythema.  Psychiatric: Her behavior is normal. Judgment normal.    Data Reviewed Patient has prior CAT scan of the abdomen and pelvis as well as plain x-rays demonstrating cholelithiasis  Assessment    Symptomatic cholelithiasis    Plan    Laparoscopic cholecystectomy and possible cholangiogram is recommended. I discussed this with her in detail. I discussed the risks of surgery which includes but is not limited to bleeding, infection, bile duct injury, bile leak, need to convert to an open procedure, cardiopulmonary issues, DVT, etc. She understands and wishes to proceed. Likelihood of success is good       Sirus Labrie A 04/17/2012, 4:21 PM

## 2012-04-20 ENCOUNTER — Ambulatory Visit (HOSPITAL_COMMUNITY)
Admission: RE | Admit: 2012-04-20 | Discharge: 2012-04-20 | Disposition: A | Discharge status: Home / Self Care | Payer: Medicare Other | Source: Ambulatory Visit | Attending: Orthopedic Surgery | Admitting: Orthopedic Surgery

## 2012-04-20 NOTE — Progress Notes (Signed)
  Patient Details  Name: [name] MRN: [id number] Date of Birth: 07/15/1936  Today's Date: 04/20/2012 Time: 4098-1191 No Charge   Pt reports that she is having gallbladder surgery on Friday and is concerned about starting therapy today when she will likely be recovering for a few weeks.  Explained to pt it would be better to start therapy after gallbladder surgery and recieve an updated referral when she returns.  See Scanned Documents for a list of gentle exercises given to pt that she can complete before gallbladder surgery. Henreitta Leber, LTR, SKTC, pirifirmis stretch, POE)   [name] 04/20/2012, 11:31 AM

## 2012-04-21 ENCOUNTER — Encounter (HOSPITAL_COMMUNITY): Payer: Self-pay | Admitting: Pharmacy Technician

## 2012-04-21 ENCOUNTER — Encounter (HOSPITAL_COMMUNITY): Payer: Self-pay | Admitting: *Deleted

## 2012-04-21 NOTE — Pre-Procedure Instructions (Addendum)
20 Lynn B Bachicha  04/21/2012   Your procedure is scheduled on:  04/24/12  Report to [facility] at 600 AM.  Call this number if you have problems the morning of surgery: [phone]   Remember:   Do not eat food:After Midnight.  May have clear liquids: up to 4 Hours before arrival 200 am.  Clear liquids include soda, tea, black coffee, apple or grape juice, broth.  Take these medicines the morning of surgery with A SIP OF WATER: norvasc, decadron if needed, STOP etodolac, fish oil , multi vit, coq10 now   Do not wear jewelry, make-up or nail polish.  Do not wear lotions, powders, or perfumes. You may wear deodorant.  Do not shave 48 hours prior to surgery. Men may shave face and neck.  Do not bring valuables to the hospital.  Contacts, dentures or bridgework may not be worn into surgery.  Leave suitcase in the car. After surgery it may be brought to your room.  For patients admitted to the hospital, checkout time is 11:00 AM the day of discharge.   Patients discharged the day of surgery will not be allowed to drive home.  Name and phone number of your driver  Sharyne Richters [phone]  Special Instructions: CHG Shower Use Special Wash: 1/2 bottle night before surgery and 1/2 bottle morning of surgery.   Please read over the following fact sheets that you were given: Pain Booklet, Coughing and Deep Breathing, MRSA Information and Surgical Site Infection Prevention

## 2012-04-22 ENCOUNTER — Encounter (HOSPITAL_COMMUNITY)
Admission: RE | Admit: 2012-04-22 | Discharge: 2012-04-22 | Disposition: A | Discharge status: Home / Self Care | Payer: Medicare Other | Source: Ambulatory Visit | Attending: Surgery | Admitting: Surgery

## 2012-04-22 DIAGNOSIS — Z01810 Encounter for preprocedural cardiovascular examination: Secondary | ICD-10-CM | POA: Diagnosis not present

## 2012-04-22 DIAGNOSIS — Z01811 Encounter for preprocedural respiratory examination: Secondary | ICD-10-CM | POA: Diagnosis not present

## 2012-04-22 DIAGNOSIS — Z01818 Encounter for other preprocedural examination: Secondary | ICD-10-CM | POA: Diagnosis not present

## 2012-04-22 DIAGNOSIS — Z01812 Encounter for preprocedural laboratory examination: Secondary | ICD-10-CM | POA: Diagnosis not present

## 2012-04-22 DIAGNOSIS — K219 Gastro-esophageal reflux disease without esophagitis: Secondary | ICD-10-CM | POA: Diagnosis not present

## 2012-04-22 DIAGNOSIS — M1990 Unspecified osteoarthritis, unspecified site: Secondary | ICD-10-CM | POA: Diagnosis not present

## 2012-04-22 DIAGNOSIS — M129 Arthropathy, unspecified: Secondary | ICD-10-CM | POA: Diagnosis not present

## 2012-04-22 DIAGNOSIS — I10 Essential (primary) hypertension: Secondary | ICD-10-CM | POA: Diagnosis not present

## 2012-04-22 DIAGNOSIS — K8020 Calculus of gallbladder without cholecystitis without obstruction: Secondary | ICD-10-CM | POA: Diagnosis not present

## 2012-04-22 DIAGNOSIS — G4730 Sleep apnea, unspecified: Secondary | ICD-10-CM | POA: Diagnosis not present

## 2012-04-22 LAB — SURGICAL PCR SCREEN: MRSA, PCR: NEGATIVE

## 2012-04-22 LAB — COMPREHENSIVE METABOLIC PANEL
ALT: 15 U/L (ref 0–35)
Albumin: 3.4 g/dL — ABNORMAL LOW (ref 3.5–5.2)
Alkaline Phosphatase: 54 U/L (ref 39–117)
BUN: 22 mg/dL (ref 6–23)
Chloride: 104 mEq/L (ref 96–112)
Glucose, Bld: 82 mg/dL (ref 70–99)
Potassium: 3.2 mEq/L — ABNORMAL LOW (ref 3.5–5.1)
Sodium: 140 mEq/L (ref 135–145)
Total Bilirubin: 0.9 mg/dL (ref 0.3–1.2)

## 2012-04-22 LAB — CBC
HCT: 38.3 % (ref 36.0–46.0)
Hemoglobin: 13.1 g/dL (ref 12.0–15.0)
WBC: 12.7 10*3/uL — ABNORMAL HIGH (ref 4.0–10.5)

## 2012-04-23 MED ORDER — CEFAZOLIN SODIUM-DEXTROSE 2-3 GM-% IV SOLR
2.0000 g | INTRAVENOUS | Status: AC
  Administered 2012-04-24: 2 g via INTRAVENOUS
  Filled 2012-04-23: qty 50

## 2012-04-23 NOTE — H&P (Signed)
Shelly Rubenstein, MD 04/17/2012 4:27 PM Signed  Patient ID: [name], female DOB: 10-05-1936, 76 y.o. MRN: [id number]  Chief Complaint   Patient presents with   .  Other     gallstones    HPI  [name] is a 76 y.o. female. This is a pleasant female referred to me by Dr. Parke Simmers for evaluation of right upper quadrant abdominal pain and possible symptomatic gallstones. The patient has had multiple attacks of right upper quadrant abdominal pain referring up into her chest and through to her back. She's had nausea but no vomiting. Her last attack was approximately 2 months ago. It occurs after a fatty meal. It is sharp in nature and moderate. She denies jaundice. She is otherwise without complaints  HPI  Past Medical History   Diagnosis  Date   .  Arthritis    .  Hyperlipidemia    .  Hypertension     Past Surgical History   Procedure  Date   .  Total knee arthroplasty  01/23/2010/ 02/21/11   .  Eye surgery    .  Abdominal hysterectomy  07/31/10    Family History   Problem  Relation  Age of Onset   .  Prostate cancer  Father     Social History  History   Substance Use Topics   .  Smoking status:  Never Smoker   .  Smokeless tobacco:  Not on file   .  Alcohol Use:  No    No Known Allergies  Current Outpatient Prescriptions   Medication  Sig  Dispense  Refill   .  amLODipine (NORVASC) 10 MG tablet  Take 10 mg by mouth daily.     Marland Kitchen  dexamethasone (DECADRON) 4 MG tablet  Take 4 mg by mouth 2 (two) times daily with a meal.     .  etodolac (LODINE XL) 400 MG 24 hr tablet      .  fexofenadine-pseudoephedrine (ALLEGRA-D) 60-120 MG per tablet  Take 1 tablet by mouth 2 (two) times daily.     Marland Kitchen  KLOR-CON M20 20 MEQ tablet       Review of Systems  Review of Systems  Constitutional: Negative for fever, chills and unexpected weight change.  HENT: Negative for hearing loss, congestion, sore throat, trouble swallowing and voice change.  Eyes: Negative for visual disturbance.    Respiratory: Negative for cough and wheezing.  Cardiovascular: Positive for leg swelling. Negative for chest pain and palpitations.  Gastrointestinal: Positive for nausea and abdominal pain. Negative for vomiting, diarrhea, constipation, blood in stool, abdominal distention and anal bleeding.  Genitourinary: Negative for hematuria, vaginal bleeding and difficulty urinating.  Musculoskeletal: Positive for joint swelling. Negative for arthralgias.  Skin: Negative for rash and wound.  Neurological: Negative for seizures, syncope and headaches.  Hematological: Negative for adenopathy. Does not bruise/bleed easily.  Psychiatric/Behavioral: Negative for confusion.   Blood pressure 132/76, pulse 78, temperature 97.4 F (36.3 C), temperature source Temporal, resp. rate 16, height 5\' 7"  (1.702 m), weight 238 lb (107.956 kg).  Physical Exam  Physical Exam  Constitutional: She is oriented to person, place, and time. She appears well-developed and well-nourished. No distress.  HENT:  Head: Normocephalic and atraumatic.  Right Ear: External ear normal.  Left Ear: External ear normal.  Nose: Nose normal.  Mouth/Throat: Oropharynx is clear and moist. No oropharyngeal exudate.  Eyes: Conjunctivae are normal. Pupils are equal, round, and reactive to light. Right eye exhibits no discharge.  Left eye exhibits no discharge. No scleral icterus.  Neck: Normal range of motion. Neck supple. No tracheal deviation present. No thyromegaly present.  Cardiovascular: Normal rate, regular rhythm, normal heart sounds and intact distal pulses.  No murmur heard.  Pulmonary/Chest: Effort normal and breath sounds normal. No respiratory distress. She has no wheezes.  Abdominal: Soft. Bowel sounds are normal. She exhibits no distension. There is no tenderness. There is no rebound and no guarding.  Musculoskeletal: She exhibits edema and tenderness.  Lymphadenopathy:  She has no cervical adenopathy.  Neurological: She is  alert and oriented to person, place, and time.  Skin: Skin is warm. No rash noted. She is not diaphoretic. No erythema.  Psychiatric: Her behavior is normal. Judgment normal.   Data Reviewed  Patient has prior CAT scan of the abdomen and pelvis as well as plain x-rays demonstrating cholelithiasis  Assessment   Symptomatic cholelithiasis   Plan   Laparoscopic cholecystectomy and possible cholangiogram is recommended. I discussed this with her in detail. I discussed the risks of surgery which includes but is not limited to bleeding, infection, bile duct injury, bile leak, need to convert to an open procedure, cardiopulmonary issues, DVT, etc. She understands and wishes to proceed. Likelihood of success is good   [name] A

## 2012-04-24 ENCOUNTER — Encounter (HOSPITAL_COMMUNITY): Payer: Self-pay | Admitting: Anesthesiology

## 2012-04-24 ENCOUNTER — Encounter (HOSPITAL_COMMUNITY)
Admission: RE | Disposition: A | Discharge status: Home / Self Care | Payer: Self-pay | Source: Ambulatory Visit | Attending: Surgery

## 2012-04-24 ENCOUNTER — Ambulatory Visit (HOSPITAL_COMMUNITY)
Admission: RE | Admit: 2012-04-24 | Discharge: 2012-04-24 | Disposition: A | Discharge status: Home / Self Care | Payer: Medicare Other | Source: Ambulatory Visit | Attending: Surgery | Admitting: Surgery

## 2012-04-24 ENCOUNTER — Ambulatory Visit (HOSPITAL_COMMUNITY): Payer: Medicare Other | Admitting: Anesthesiology

## 2012-04-24 ENCOUNTER — Encounter (HOSPITAL_COMMUNITY): Payer: Self-pay | Admitting: *Deleted

## 2012-04-24 DIAGNOSIS — G4730 Sleep apnea, unspecified: Secondary | ICD-10-CM | POA: Insufficient documentation

## 2012-04-24 DIAGNOSIS — K8020 Calculus of gallbladder without cholecystitis without obstruction: Secondary | ICD-10-CM | POA: Insufficient documentation

## 2012-04-24 DIAGNOSIS — Z01812 Encounter for preprocedural laboratory examination: Secondary | ICD-10-CM | POA: Insufficient documentation

## 2012-04-24 DIAGNOSIS — K219 Gastro-esophageal reflux disease without esophagitis: Secondary | ICD-10-CM | POA: Insufficient documentation

## 2012-04-24 DIAGNOSIS — Z01810 Encounter for preprocedural cardiovascular examination: Secondary | ICD-10-CM | POA: Insufficient documentation

## 2012-04-24 DIAGNOSIS — Z01818 Encounter for other preprocedural examination: Secondary | ICD-10-CM | POA: Insufficient documentation

## 2012-04-24 DIAGNOSIS — M129 Arthropathy, unspecified: Secondary | ICD-10-CM | POA: Insufficient documentation

## 2012-04-24 DIAGNOSIS — K8010 Calculus of gallbladder with chronic cholecystitis without obstruction: Secondary | ICD-10-CM | POA: Diagnosis not present

## 2012-04-24 DIAGNOSIS — I10 Essential (primary) hypertension: Secondary | ICD-10-CM | POA: Insufficient documentation

## 2012-04-24 DIAGNOSIS — M1990 Unspecified osteoarthritis, unspecified site: Secondary | ICD-10-CM | POA: Insufficient documentation

## 2012-04-24 HISTORY — PX: CHOLECYSTECTOMY: SHX55

## 2012-04-24 HISTORY — DX: Gastro-esophageal reflux disease without esophagitis: K21.9

## 2012-04-24 HISTORY — DX: Sleep apnea, unspecified: G47.30

## 2012-04-24 SURGERY — LAPAROSCOPIC CHOLECYSTECTOMY
Anesthesia: General | Site: Abdomen | Wound class: Contaminated

## 2012-04-24 MED ORDER — FENTANYL CITRATE 0.05 MG/ML IJ SOLN
INTRAMUSCULAR | Status: DC | PRN
  Administered 2012-04-24 (×2): 50 ug via INTRAVENOUS
  Administered 2012-04-24: 100 ug via INTRAVENOUS
  Administered 2012-04-24 (×3): 50 ug via INTRAVENOUS

## 2012-04-24 MED ORDER — HYDROMORPHONE HCL PF 1 MG/ML IJ SOLN
0.2500 mg | INTRAMUSCULAR | Status: DC | PRN
  Administered 2012-04-24 (×2): 0.25 mg via INTRAVENOUS

## 2012-04-24 MED ORDER — ACETAMINOPHEN 650 MG RE SUPP
650.0000 mg | RECTAL | Status: DC | PRN
  Filled 2012-04-24: qty 1

## 2012-04-24 MED ORDER — PROPOFOL 10 MG/ML IV EMUL
INTRAVENOUS | Status: DC | PRN
  Administered 2012-04-24: 160 mg via INTRAVENOUS
  Administered 2012-04-24: 40 mg via INTRAVENOUS

## 2012-04-24 MED ORDER — MIDAZOLAM HCL 5 MG/5ML IJ SOLN
INTRAMUSCULAR | Status: DC | PRN
  Administered 2012-04-24: 2 mg via INTRAVENOUS

## 2012-04-24 MED ORDER — ROCURONIUM BROMIDE 100 MG/10ML IV SOLN
INTRAVENOUS | Status: DC | PRN
  Administered 2012-04-24 (×2): 10 mg via INTRAVENOUS

## 2012-04-24 MED ORDER — SODIUM CHLORIDE 0.9 % IV SOLN: INTRAVENOUS | Status: DC

## 2012-04-24 MED ORDER — 0.9 % SODIUM CHLORIDE (POUR BTL) OPTIME
TOPICAL | Status: DC | PRN
  Administered 2012-04-24: 1000 mL

## 2012-04-24 MED ORDER — MORPHINE SULFATE 2 MG/ML IJ SOLN: 2.0000 mg | INTRAMUSCULAR | Status: DC | PRN

## 2012-04-24 MED ORDER — GLYCOPYRROLATE 0.2 MG/ML IJ SOLN
INTRAMUSCULAR | Status: DC | PRN
  Administered 2012-04-24: .8 mg via INTRAVENOUS

## 2012-04-24 MED ORDER — HYDROMORPHONE HCL PF 1 MG/ML IJ SOLN
INTRAMUSCULAR | Status: AC
  Filled 2012-04-24: qty 1

## 2012-04-24 MED ORDER — ONDANSETRON HCL 4 MG/2ML IJ SOLN: 4.0000 mg | Freq: Four times a day (QID) | INTRAMUSCULAR | Status: DC | PRN

## 2012-04-24 MED ORDER — LIDOCAINE HCL (CARDIAC) 20 MG/ML IV SOLN
INTRAVENOUS | Status: DC | PRN
  Administered 2012-04-24: 50 mg via INTRAVENOUS

## 2012-04-24 MED ORDER — BUPIVACAINE-EPINEPHRINE 0.25% -1:200000 IJ SOLN
INTRAMUSCULAR | Status: DC | PRN
  Administered 2012-04-24: 20 mL

## 2012-04-24 MED ORDER — OXYCODONE HCL 5 MG PO TABS: 5.0000 mg | ORAL_TABLET | ORAL | Status: DC | PRN

## 2012-04-24 MED ORDER — SODIUM CHLORIDE 0.9 % IJ SOLN: 3.0000 mL | INTRAMUSCULAR | Status: DC | PRN

## 2012-04-24 MED ORDER — SODIUM CHLORIDE 0.9 % IV SOLN: 250.0000 mL | INTRAVENOUS | Status: DC | PRN

## 2012-04-24 MED ORDER — ACETAMINOPHEN 325 MG PO TABS
650.0000 mg | ORAL_TABLET | ORAL | Status: DC | PRN
  Filled 2012-04-24: qty 2

## 2012-04-24 MED ORDER — LACTATED RINGERS IV SOLN
INTRAVENOUS | Status: DC | PRN
  Administered 2012-04-24 (×2): via INTRAVENOUS

## 2012-04-24 MED ORDER — ONDANSETRON HCL 4 MG/2ML IJ SOLN
INTRAMUSCULAR | Status: DC | PRN
  Administered 2012-04-24: 4 mg via INTRAVENOUS

## 2012-04-24 MED ORDER — NEOSTIGMINE METHYLSULFATE 1 MG/ML IJ SOLN
INTRAMUSCULAR | Status: DC | PRN
  Administered 2012-04-24: 4 mg via INTRAVENOUS

## 2012-04-24 MED ORDER — HYDROCODONE-ACETAMINOPHEN 5-325 MG PO TABS: 1.0000 | ORAL_TABLET | ORAL | Status: AC | PRN

## 2012-04-24 MED ORDER — SODIUM CHLORIDE 0.9 % IJ SOLN: 3.0000 mL | Freq: Two times a day (BID) | INTRAMUSCULAR | Status: DC

## 2012-04-24 MED ORDER — SODIUM CHLORIDE 0.9 % IR SOLN
Status: DC | PRN
  Administered 2012-04-24: 1

## 2012-04-24 MED ORDER — SUCCINYLCHOLINE CHLORIDE 20 MG/ML IJ SOLN
INTRAMUSCULAR | Status: DC | PRN
  Administered 2012-04-24: 100 mg via INTRAVENOUS

## 2012-04-24 SURGICAL SUPPLY — 40 items
APPLIER CLIP 5 13 M/L LIGAMAX5 (MISCELLANEOUS) ×3
BANDAGE ADHESIVE 1X3 (GAUZE/BANDAGES/DRESSINGS) IMPLANT
BENZOIN TINCTURE PRP APPL 2/3 (GAUZE/BANDAGES/DRESSINGS) IMPLANT
CANISTER SUCTION 2500CC (MISCELLANEOUS) ×3 IMPLANT
CHLORAPREP W/TINT 26ML (MISCELLANEOUS) ×3 IMPLANT
CLIP APPLIE 5 13 M/L LIGAMAX5 (MISCELLANEOUS) ×2 IMPLANT
CLOTH BEACON ORANGE TIMEOUT ST (SAFETY) ×3 IMPLANT
COVER MAYO STAND STRL (DRAPES) IMPLANT
COVER SURGICAL LIGHT HANDLE (MISCELLANEOUS) ×3 IMPLANT
DECANTER SPIKE VIAL GLASS SM (MISCELLANEOUS) IMPLANT
DRAPE C-ARM 42X72 X-RAY (DRAPES) IMPLANT
ELECT REM PT RETURN 9FT ADLT (ELECTROSURGICAL) ×3
ELECTRODE REM PT RTRN 9FT ADLT (ELECTROSURGICAL) ×2 IMPLANT
GAUZE SPONGE 2X2 8PLY STRL LF (GAUZE/BANDAGES/DRESSINGS) ×2 IMPLANT
GLOVE BIOGEL PI IND STRL 7.0 (GLOVE) ×2 IMPLANT
GLOVE BIOGEL PI INDICATOR 7.0 (GLOVE) ×1
GLOVE ECLIPSE 7.0 STRL STRAW (GLOVE) ×3 IMPLANT
GLOVE SURG SIGNA 7.5 PF LTX (GLOVE) ×3 IMPLANT
GLOVE SURG SS PI 7.0 STRL IVOR (GLOVE) ×3 IMPLANT
GOWN PREVENTION PLUS XLARGE (GOWN DISPOSABLE) ×3 IMPLANT
GOWN STRL NON-REIN LRG LVL3 (GOWN DISPOSABLE) ×6 IMPLANT
KIT BASIN OR (CUSTOM PROCEDURE TRAY) ×3 IMPLANT
KIT ROOM TURNOVER OR (KITS) ×3 IMPLANT
NS IRRIG 1000ML POUR BTL (IV SOLUTION) ×3 IMPLANT
PAD ARMBOARD 7.5X6 YLW CONV (MISCELLANEOUS) ×6 IMPLANT
POUCH SPECIMEN RETRIEVAL 10MM (ENDOMECHANICALS) ×3 IMPLANT
SCISSORS LAP 5X35 DISP (ENDOMECHANICALS) IMPLANT
SET CHOLANGIOGRAPH 5 50 .035 (SET/KITS/TRAYS/PACK) IMPLANT
SET IRRIG TUBING LAPAROSCOPIC (IRRIGATION / IRRIGATOR) ×3 IMPLANT
SLEEVE ENDOPATH XCEL 5M (ENDOMECHANICALS) ×6 IMPLANT
SPECIMEN JAR SMALL (MISCELLANEOUS) ×3 IMPLANT
SPONGE GAUZE 2X2 STER 10/PKG (GAUZE/BANDAGES/DRESSINGS) ×1
STRIP CLOSURE SKIN 1/2X4 (GAUZE/BANDAGES/DRESSINGS) ×3 IMPLANT
SUT MON AB 4-0 PC3 18 (SUTURE) ×3 IMPLANT
TOWEL OR 17X24 6PK STRL BLUE (TOWEL DISPOSABLE) ×3 IMPLANT
TOWEL OR 17X26 10 PK STRL BLUE (TOWEL DISPOSABLE) ×3 IMPLANT
TRAY LAPAROSCOPIC (CUSTOM PROCEDURE TRAY) ×3 IMPLANT
TROCAR XCEL BLUNT TIP 100MML (ENDOMECHANICALS) ×3 IMPLANT
TROCAR XCEL NON-BLD 5MMX100MML (ENDOMECHANICALS) ×3 IMPLANT
WATER STERILE IRR 1000ML POUR (IV SOLUTION) IMPLANT

## 2012-04-24 NOTE — Anesthesia Procedure Notes (Signed)
Procedure Name: Intubation Date/Time: 04/24/2012 9:56 AM Performed by: Garen Lah Pre-anesthesia Checklist: Patient identified, Timeout performed, Emergency Drugs available, Suction available and Patient being monitored Patient Re-evaluated:Patient Re-evaluated prior to inductionOxygen Delivery Method: Circle system utilized Preoxygenation: Pre-oxygenation with 100% oxygen Intubation Type: IV induction Laryngoscope Size: Mac and 3 Grade View: Grade I Tube type: Oral Tube size: 7.5 mm Number of attempts: 1 Airway Equipment and Method: Stylet Placement Confirmation: ETT inserted through vocal cords under direct vision,  positive ETCO2 and breath sounds checked- equal and bilateral Secured at: 21 cm Tube secured with: Tape Dental Injury: Teeth and Oropharynx as per pre-operative assessment

## 2012-04-24 NOTE — Anesthesia Postprocedure Evaluation (Signed)
Anesthesia Post Note  Patient: [name]  Procedure(s) Performed: Procedure(s) (LRB): LAPAROSCOPIC CHOLECYSTECTOMY (N/A)  Anesthesia type: General  Patient location: PACU  Post pain: Pain level controlled and Adequate analgesia  Post assessment: Post-op Vital signs reviewed, Patient's Cardiovascular Status Stable, Respiratory Function Stable, Patent Airway and Pain level controlled  Last Vitals:  Filed Vitals:   04/24/12 1058  BP:   Pulse:   Temp: 36.1 C  Resp:     Post vital signs: Reviewed and stable  Level of consciousness: awake, alert  and oriented  Complications: No apparent anesthesia complications

## 2012-04-24 NOTE — Progress Notes (Signed)
Lunch relief provided. 

## 2012-04-24 NOTE — Transfer of Care (Signed)
Immediate Anesthesia Transfer of Care Note  Patient: [name]  Procedure(s) Performed: Procedure(s) (LRB): LAPAROSCOPIC CHOLECYSTECTOMY (N/A)  Patient Location: PACU  Anesthesia Type: General  Level of Consciousness: awake, alert  and oriented  Airway & Oxygen Therapy: Patient Spontanous Breathing and Patient connected to nasal cannula oxygen  Post-op Assessment: Report given to PACU RN and Post -op Vital signs reviewed and stable  Post vital signs: Reviewed and stable  Complications: No apparent anesthesia complications

## 2012-04-24 NOTE — Preoperative (Signed)
Beta Blockers   Reason not to administer Beta Blockers:Not Applicable. No home beta blockers 

## 2012-04-24 NOTE — Interval H&P Note (Signed)
History and Physical Interval Note:  No change in h and p  04/24/2012 7:38 AM  [name]  has presented today for surgery, with the diagnosis of gallstones  The various methods of treatment have been discussed with the patient and family. After consideration of risks, benefits and other options for treatment, the patient has consented to  Procedure(s) (LRB): LAPAROSCOPIC CHOLECYSTECTOMY (N/A) INTRAOPERATIVE CHOLANGIOGRAM (N/A) as a surgical intervention .  The patients' history has been reviewed, patient examined, no change in status, stable for surgery.  I have reviewed the patients' chart and labs.  Questions were answered to the patient's satisfaction.     [name] A

## 2012-04-24 NOTE — Op Note (Signed)
Laparoscopic Cholecystectomy Procedure Note  Indications: This patient presents with symptomatic gallbladder disease and will undergo laparoscopic cholecystectomy.  Pre-operative Diagnosis: Calculus of gallbladder without mention of cholecystitis or obstruction  Post-operative Diagnosis: Same  Surgeon: Abigail Miyamoto A   Assistants: 0  Anesthesia: General endotracheal anesthesia  ASA Class: 2  Procedure Details  The patient was seen again in the Holding Room. The risks, benefits, complications, treatment options, and expected outcomes were discussed with the patient. The possibilities of reaction to medication, pulmonary aspiration, perforation of viscus, bleeding, recurrent infection, finding a normal gallbladder, the need for additional procedures, failure to diagnose a condition, the possible need to convert to an open procedure, and creating a complication requiring transfusion or operation were discussed with the patient. The likelihood of improving the patient's symptoms with return to their baseline status is good.  The patient and/or family concurred with the proposed plan, giving informed consent. The site of surgery properly noted. The patient was taken to Operating Room, identified as [name] and the procedure verified as Laparoscopic Cholecystectomy with Intraoperative Cholangiogram. A Time Out was held and the above information confirmed.  Prior to the induction of general anesthesia, antibiotic prophylaxis was administered. General endotracheal anesthesia was then administered and tolerated well. After the induction, the abdomen was prepped with Chloraprep and draped in sterile fashion. The patient was positioned in the supine position.  Local anesthetic agent was injected into the skin near the umbilicus and an incision made. We dissected down to the abdominal fascia with blunt dissection.  The fascia was incised vertically and we entered the peritoneal cavity  bluntly.  A pursestring suture of 0-Vicryl was placed around the fascial opening.  The Hasson cannula was inserted and secured with the stay suture.  Pneumoperitoneum was then created with CO2 and tolerated well without any adverse changes in the patient's vital signs. An 11-mm port was placed in the subxiphoid position.  Two 5-mm ports were placed in the right upper quadrant. All skin incisions were infiltrated with a local anesthetic agent before making the incision and placing the trocars.   We positioned the patient in reverse Trendelenburg, tilted slightly to the patient's left.  The gallbladder was identified, the fundus grasped and retracted cephalad. Adhesions were lysed bluntly and with the electrocautery where indicated, taking care not to injure any adjacent organs or viscus. The infundibulum was grasped and retracted laterally, exposing the peritoneum overlying the triangle of Calot. This was then divided and exposed in a blunt fashion. The cystic duct was clearly identified and bluntly dissected circumferentially. A critical view of the cystic duct and cystic artery was obtained.  The cystic duct was then ligated with clips and divided. The cystic artery was, dissected free, ligated with clips and divided as well.   The gallbladder was dissected from the liver bed in retrograde fashion with the electrocautery. The gallbladder was removed and placed in an Endocatch sac. The liver bed was irrigated and inspected. Hemostasis was achieved with the electrocautery. Copious irrigation was utilized and was repeatedly aspirated until clear.  The gallbladder and Endocatch sac were then removed through the umbilical port site.  The pursestring suture was used to close the umbilical fascia.    We again inspected the right upper quadrant for hemostasis.  Pneumoperitoneum was released as we removed the trocars.  4-0 Monocryl was used to close the skin.   Benzoin, steri-strips, and clean dressings were applied.  The patient was then extubated and brought to the recovery room  in stable condition. Instrument, sponge, and needle counts were correct at closure and at the conclusion of the case.   Findings: Cholelithiasis with thickened gallbladder  Estimated Blood Loss: Minimal         Drains: 0         Specimens: Gallbladder           Complications: None; patient tolerated the procedure well.         Disposition: PACU - hemodynamically stable.         Condition: stable

## 2012-04-24 NOTE — Discharge Instructions (Signed)
[facility] ______[facility] °LAPAROSCOPIC SURGERY: POST OP INSTRUCTIONS °Always review your discharge instruction sheet given to you by the facility where your surgery was performed. °IF YOU HAVE DISABILITY OR FAMILY LEAVE FORMS, YOU MUST BRING THEM TO THE OFFICE FOR PROCESSING.   °DO NOT GIVE THEM TO YOUR DOCTOR. ° °1. A prescription for pain medication may be given to you upon discharge.  Take your pain medication as prescribed, if needed.  If narcotic pain medicine is not needed, then you may take acetaminophen (Tylenol) or ibuprofen (Advil) as needed. °2. Take your usually prescribed medications unless otherwise directed. °3. If you need a refill on your pain medication, please contact your pharmacy.  They will contact our office to request authorization. Prescriptions will not be filled after 5pm or on week-ends. °4. You should follow a light diet the first few days after arrival home, such as soup and crackers, etc.  Be sure to include lots of fluids daily. °5. Most patients will experience some swelling and bruising in the area of the incisions.  Ice packs will help.  Swelling and bruising can take several days to resolve.  °6. It is common to experience some constipation if taking pain medication after surgery.  Increasing fluid intake and taking a stool softener (such as Colace) will usually help or prevent this problem from occurring.  A mild laxative (Milk of Magnesia or Miralax) should be taken according to package instructions if there are no bowel movements after 48 hours. °7. Unless discharge instructions indicate otherwise, you may remove your bandages 24-48 hours after surgery, and you may shower at that time.  You may have steri-strips (small skin tapes) in place directly over the incision.  These strips should be left on the skin for 7-10 days.  If your surgeon used skin glue on the incision, you may shower in 24 hours.  The glue will flake off over the next 2-3 weeks.  Any sutures or  staples will be removed at the office during your follow-up visit. °8. ACTIVITIES:  You may resume regular (light) daily activities beginning the next day--such as daily self-care, walking, climbing stairs--gradually increasing activities as tolerated.  You may have sexual intercourse when it is comfortable.  Refrain from any heavy lifting or straining until approved by your doctor. °a. You may drive when you are no longer taking prescription pain medication, you can comfortably wear a seatbelt, and you can safely maneuver your car and apply brakes. °b. RETURN TO WORK:  __________________________________________________________ °9. You should see your doctor in the office for a follow-up appointment approximately 2-3 weeks after your surgery.  Make sure that you call for this appointment within a day or two after you arrive home to insure a convenient appointment time. °10. OTHER INSTRUCTIONS: __________________________________________________________________________________________________________________________ __________________________________________________________________________________________________________________________ °WHEN TO CALL YOUR DOCTOR: °1. Fever over 101.0 °2. Inability to urinate °3. Continued bleeding from incision. °4. Increased pain, redness, or drainage from the incision. °5. Increasing abdominal pain ° °The clinic staff is available to answer your questions during regular business hours.  Please don’t hesitate to call and ask to speak to one of the nurses for clinical concerns.  If you have a medical emergency, go to the nearest emergency room or call 911.  A surgeon from [facility] is always on call at the hospital. °[address], [address], [place], [place]  [address] ? [address] [address], [place],    [address] °[phone] ? [phone] ? FAX [phone] °Web site:   [email or web address] °

## 2012-04-24 NOTE — Progress Notes (Signed)
DR Magnus Ivan NOTIFIED OF SMALL AMT BLEEDING NOTED AT 2 OF ABD SITES AND PER DR BLACKMAN PLACED 2X2'S AND SMALL TEGADERMS ON SITE

## 2012-04-24 NOTE — Anesthesia Preprocedure Evaluation (Addendum)
Anesthesia Evaluation  Patient identified by MRN, date of birth, ID band Patient awake    Reviewed: Allergy & Precautions, H&P , NPO status , Patient's Chart, lab work & pertinent test results, reviewed documented beta blocker date and time   Airway Mallampati: II  Neck ROM: full    Dental  (+) Teeth Intact and Dental Advisory Given,    Pulmonary sleep apnea and Continuous Positive Airway Pressure Ventilation ,  Pt Rarely uses her C-Pap machine @ home   Pulmonary exam normal       Cardiovascular hypertension, Pt. on medications Rhythm:Regular     Neuro/Psych    GI/Hepatic GERD-  Controlled,  Endo/Other  obese  Renal/GU      Musculoskeletal  (+) Arthritis -, Osteoarthritis,    Abdominal   Peds  Hematology   Anesthesia Other Findings Front teeth appear darkened compared to lower teeth, hairline fractures pres.  Reproductive/Obstetrics                        Anesthesia Physical Anesthesia Plan  ASA: III  Anesthesia Plan: General   Post-op Pain Management:    Induction: Intravenous  Airway Management Planned: Oral ETT  Additional Equipment:   Intra-op Plan:   Post-operative Plan: Extubation in OR  Informed Consent: I have reviewed the patients History and Physical, chart, labs and discussed the procedure including the risks, benefits and alternatives for the proposed anesthesia with the patient or authorized representative who has indicated his/her understanding and acceptance.   Dental advisory given  Plan Discussed with: CRNA and Surgeon  Anesthesia Plan Comments:        Anesthesia Quick Evaluation

## 2012-04-27 ENCOUNTER — Encounter (HOSPITAL_COMMUNITY): Payer: Self-pay | Admitting: Surgery

## 2012-04-29 DIAGNOSIS — M5430 Sciatica, unspecified side: Secondary | ICD-10-CM | POA: Diagnosis not present

## 2012-05-11 ENCOUNTER — Ambulatory Visit (HOSPITAL_COMMUNITY): Payer: Medicare Other | Admitting: Physical Therapy

## 2012-05-14 ENCOUNTER — Ambulatory Visit (INDEPENDENT_AMBULATORY_CARE_PROVIDER_SITE_OTHER): Payer: Medicare Other | Admitting: Surgery

## 2012-05-14 ENCOUNTER — Encounter (INDEPENDENT_AMBULATORY_CARE_PROVIDER_SITE_OTHER): Payer: Self-pay | Admitting: Surgery

## 2012-05-14 VITALS — BP 152/80 | HR 68 | Temp 97.6°F | Resp 16 | Ht 67.0 in | Wt 231.8 lb

## 2012-05-14 DIAGNOSIS — Z09 Encounter for follow-up examination after completed treatment for conditions other than malignant neoplasm: Secondary | ICD-10-CM

## 2012-05-14 NOTE — Progress Notes (Signed)
Subjective:     Patient ID: [name], female   DOB: [birth year], 76 y.o.   MRN: [id number]  HPI She is here for her first postop visit status post laparoscopic cholecystectomy. She is doing well has no complaints. She is eating well and moving her bowels well  Review of Systems     Objective:   Physical Exam    on exam, her incision is well-healed. Her abdomen is soft. The final pathology showed chronic cholecystitis with cholelithiasis. Assessment:     Patient doing well status post laparoscopic cholecystectomy    Plan:     She may return to normal activity. I will see her back as needed

## 2012-05-18 ENCOUNTER — Ambulatory Visit (HOSPITAL_COMMUNITY)
Admission: RE | Admit: 2012-05-18 | Discharge: 2012-05-18 | Disposition: A | Discharge status: Home / Self Care | Payer: Medicare Other | Source: Ambulatory Visit | Attending: Orthopedic Surgery | Admitting: Orthopedic Surgery

## 2012-05-18 DIAGNOSIS — IMO0001 Reserved for inherently not codable concepts without codable children: Secondary | ICD-10-CM | POA: Insufficient documentation

## 2012-05-18 DIAGNOSIS — M6281 Muscle weakness (generalized): Secondary | ICD-10-CM | POA: Diagnosis not present

## 2012-05-18 DIAGNOSIS — R262 Difficulty in walking, not elsewhere classified: Secondary | ICD-10-CM | POA: Diagnosis not present

## 2012-05-18 NOTE — Evaluation (Signed)
Physical Therapy Evaluation  Patient Details  Name: PAMI WOOL MRN: [id number] Date of Birth: 01-18-1936  Today's Date: 05/18/2012 Time: 1440-1520 PT Time Calculation (min): 40 min  Visit#: 1  of 6   Re-eval: 06/01/12 Assessment Diagnosis: instability of low back Next MD Visit: 05/26/2012  Authorization: medicare G code initial CJ goal CI;  Goal based on oswestry pain scale.      Past Medical History:  Past Medical History  Diagnosis Date  . Arthritis   . Hyperlipidemia   . Hypertension   . Sleep apnea     cpap 3 yrs no problems use 1/2 tx  . GERD (gastroesophageal reflux disease)     occ   Past Surgical History:  Past Surgical History  Procedure Date  . Total knee arthroplasty 01/23/2010/ 02/21/11    bil  . Abdominal hysterectomy 07/31/10  . Tonsillectomy   . Eye surgery     bil  . Cholecystectomy 04/24/2012    Procedure: LAPAROSCOPIC CHOLECYSTECTOMY;  Surgeon: Shelly Rubenstein, MD;  Location: [facility] OR;  Service: General;  Laterality: N/A;    Subjective Symptoms/Limitations Symptoms: Ms. Hannig states that she has had low back pain for many years but the pain became worse and has not gone away for the past three months.  She states the pain is only on her left side, the pain does not radiate.  She is being referred to PT to decrease her pain and improve her quality of life. How long can you sit comfortably?: The patient states she can sit for 30 minutes. How long can you stand comfortably?: The patient states she is able to stand for 30 minutes. How long can you walk comfortably?: The patient states she is able to walk for ten minutes.  If she knows she is going to be walking for a while such as going to the mall she will take her cane with her. Pain Assessment Currently in Pain?: Yes Pain Score:   5 (Worst pain is a 10/10 best is a 0 when she gets up .) Pain Location: Back Pain Orientation: Left;Lower Pain Type: Chronic pain Pain Onset: More than a month ago Pain  Frequency: Constant Pain Relieving Factors: ice, icy hot Effect of Pain on Daily Activities:  The more the patient does the more pain she has.    Prior Functioning  Home Living Lives With: Family Prior Function Level of Independence: Independent with basic ADLs Driving: Yes Vocation: Retired Leisure: Hobbies-yes (Comment) Comments: reads  Cognition/Observation Cognition Overall Cognitive Status: Appears within functional limits for tasks assessed  Sensation/Coordination/Flexibility/Functional Tests Functional Tests Functional Tests: oswestry 32/50  Assessment RLE Strength RLE Overall Strength: Within Functional Limits for tasks assessed LLE Strength LLE Overall Strength: Within Functional Limits for tasks assessed Lumbar AROM Lumbar Flexion: wnl Lumbar Extension:  (Pt stands 20 degrees flexed.  Ext decreased 25%) Lumbar - Right Side Bend: wfl Lumbar - Left Side Bend: wfl Lumbar - Right Rotation: wfl Lumbar - Left Rotation: wfl Lumbar Strength Overall Lumbar Strength:  (3+/5) Palpation Palpation:  (L ASIS high; L iliac crest high, L SI low with tenderness )  Exercise/Treatments     Stretches Single Knee to Chest Stretch: 5 reps;Limitations Standing Extension: 5 reps  Modalities Modalities: Ultrasound Manual Therapy Manual Therapy: Other (comment) Other Manual Therapy: mm energy techniques to L SI Ultrasound Ultrasound Location: L SI Ultrasound Parameters: 1.5 w/cm2 @ 1 MG HZ x 7:00 Ultrasound Goals: Pain  Physical Therapy Assessment and Plan PT Assessment and Plan Clinical  Impression Statement: Pt with L SI pain postural changes and weak core mm who will benefit from skilled PT for education and mm energy technique to improve alignment of spine.   Pt will benefit from skilled therapeutic intervention in order to improve on the following deficits: Decreased activity tolerance;Pain;Difficulty walking Rehab Potential: Good PT Frequency: Min 3X/week PT  Duration:  (2 weeks) PT Treatment/Interventions: Patient/family education;Therapeutic exercise;Modalities;Manual techniques PT Plan: begin ab sets, bent knee lift and bridging next treatment.  Assess SI alignment and adjust as indicated    Goals Home Exercise Program Pt will Perform Home Exercise Program: Independently PT Short Term Goals Time to Complete Short Term Goals: 2 weeks PT Short Term Goal 1: Pain no greater than a 3 PT Short Term Goal 2: Pt able to stand/walk for over an hour without increased pain  Problem List Patient Active Problem List  Diagnosis  . Symptomatic cholelithiasis  . Difficulty in walking    PT - End of Session Activity Tolerance: Patient tolerated treatment well General Behavior During Session: [facility] for tasks performed PT Plan of Care PT Home Exercise Plan: given Consulted and Agree with Plan of Care: Patient  GP Functional Assessment Tool Used: Oswestry Functional Limitation: Mobility: Walking and moving around Mobility: Walking and Moving Around Current Status (Z6109): At least 20 percent but less than 40 percent impaired, limited or restricted Mobility: Walking and Moving Around Goal Status [phone]): At least 1 percent but less than 20 percent impaired, limited or restricted  Tyann Niehaus,CINDY 05/18/2012, 4:36 PM  Physician Documentation Your signature is required to indicate approval of the treatment plan as stated above.  Please sign and either send electronically or make a copy of this report for your files and return this physician signed original.   Please mark one 1.__approve of plan  2. ___approve of plan with the following conditions.   ______________________________                                                          _____________________ Physician Signature                                                                                                             Date

## 2012-05-20 ENCOUNTER — Ambulatory Visit (HOSPITAL_COMMUNITY)
Admission: RE | Admit: 2012-05-20 | Discharge: 2012-05-20 | Disposition: A | Discharge status: Home / Self Care | Payer: Medicare Other | Source: Ambulatory Visit | Attending: Orthopedic Surgery | Admitting: Orthopedic Surgery

## 2012-05-20 NOTE — Progress Notes (Signed)
Physical Therapy Treatment Patient Details  Name: [name] MRN: [id number] Date of Birth: 1936/06/18  Today's Date: 05/20/2012 Time: 7829-5621 PT Time Calculation (min): 40 min Visit#: 2  of 6   Re-eval: 06/01/12 Authorization: medicare G code initial CJ goal CI; Goal based on oswestry pain scale  Charges:  therex 14', manual therapy 10', ultrasound 8'  Subjective: Symptoms/Limitations Symptoms: Pt. states last treatment helped and currently has pain of 5/10. Pain Assessment Currently in Pain?: Yes Pain Score:   5 Pain Location: Hip Pain Orientation: Left   Exercise/Treatments Standing Extension: 5 reps Supine Ab Set: 10 reps;5 seconds Bent Knee Raise: 10 reps Bridge: 10 reps   Modalities Modalities: Ultrasound Manual Therapy Other Manual Therapy: mm energy for L SI anterior rotation Ultrasound Ultrasound Location: L SI Ultrasound Parameters: 1.5 w/cm2 @ 1 MG HZ x 8:00  Ultrasound Goals: Pain  Physical Therapy Assessment and Plan PT Assessment and Plan Clinical Impression Statement: Added bent knee raises and bridging to POC with good control/form. Pt. L SI slightly rotated but able to correct with mm energy technique.  Pt. completely pain free at end of session. PT Plan: Continue to progress stabilization and alignment of L SI.     Problem List Patient Active Problem List  Diagnosis  . Symptomatic cholelithiasis  . Difficulty in walking    PT - End of Session Activity Tolerance: Patient tolerated treatment well General Behavior During Session: [facility] for tasks performed Cognition: [facility] for tasks performed  GP Functional Assessment Tool Used: oswestry pain scale  [name], PTA/CLT 05/20/2012, 4:49 PM

## 2012-05-25 ENCOUNTER — Ambulatory Visit (HOSPITAL_COMMUNITY)
Admission: RE | Admit: 2012-05-25 | Discharge: 2012-05-25 | Disposition: A | Discharge status: Home / Self Care | Payer: Medicare Other | Source: Ambulatory Visit | Attending: Orthopedic Surgery | Admitting: Orthopedic Surgery

## 2012-05-25 NOTE — Progress Notes (Signed)
Physical Therapy Treatment Patient Details  Name: [name] MRN: [id number] Date of Birth: 08-27-1936  Today's Date: 05/25/2012 Time: 4098-1191 PT Time Calculation (min): 41 min  Visit#: 3  of 6   Re-eval: 06/01/12 Charges: Therex x 30' [place] 8'  Authorization: medicare G code initial CJ goal CI; Goal based on oswestry pain scale    Subjective: Symptoms/Limitations Symptoms: Pt states that her back is very sore today. Pain Assessment Currently in Pain?: Yes Pain Score:   4 Pain Location: Back Pain Orientation: Right;Left   Exercise/Treatments Stretches Single Knee to Chest Stretch: 3 reps;30 seconds Supine Ab Set: 10 reps;5 seconds Bent Knee Raise: 10 reps Bridge: 10 reps;5 seconds Straight Leg Raise: 10 reps Prone  Other Prone Lumbar Exercises: POE x 3'  Modalities Modalities: Ultrasound Ultrasound Ultrasound Location: B SI Ultrasound Parameters: 1.5 w/cm2 @ 1 MG HZ x 8' (4' each) Ultrasound Goals: Pain  Physical Therapy Assessment and Plan PT Assessment and Plan Clinical Impression Statement: SI in alignment this session, MET not needed. Pt completes therex with minimal need for cueing. Pt displays good core contraction with stabilization exercises. Pt appears to receive relief from lumber ext. [place] continued secondary to decreased pain with [place] last session. PT reports 0/10 pain at end of session. PT Plan: Continue to progress per PT POC.     Problem List Patient Active Problem List  Diagnosis  . Symptomatic cholelithiasis  . Difficulty in walking    PT - End of Session Activity Tolerance: Patient tolerated treatment well General Behavior During Session: [facility] for tasks performed Cognition: [facility] for tasks performed  GP Functional Assessment Tool Used: oswestry pain scale  Seth Bake, PTA 05/25/2012, 5:21 PM

## 2012-05-27 ENCOUNTER — Ambulatory Visit (HOSPITAL_COMMUNITY)
Admission: RE | Admit: 2012-05-27 | Discharge: 2012-05-27 | Disposition: A | Discharge status: Home / Self Care | Payer: Medicare Other | Source: Ambulatory Visit | Attending: Orthopedic Surgery | Admitting: Orthopedic Surgery

## 2012-05-27 NOTE — Progress Notes (Signed)
Physical Therapy Treatment Patient Details  Name: [name] MRN: [id number] Date of Birth: November 05, 1936  Today's Date: 05/27/2012 Time: 1522-1603 PT Time Calculation (min): 41 min  Visit#: 4  of 6   Re-eval: 06/01/12 Charges: Therex x 32' [place] x8'  Authorization: medicare G code initial CJ goal CI; Goal based on oswestry pain scale    Subjective: Symptoms/Limitations Symptoms: Pt states that the [place] really helped her pain last session. She doesn't remember how long the relief lasted. Pain Assessment Currently in Pain?: Yes Pain Score:   3 Pain Location: Back Pain Orientation: Left   Exercise/Treatments Supine Ab Set: 10 reps;5 seconds Bent Knee Raise: 10 reps Bridge: 15 reps;5 seconds Straight Leg Raise: 10 reps Prone  Straight Leg Raise: 10 reps  Modalities Modalities: Ultrasound Ultrasound Ultrasound Location: L SI Ultrasound Parameters: 1.5 w/cm2 @ 1 MG HZ x 8' Ultrasound Goals: Pain  Physical Therapy Assessment and Plan PT Assessment and Plan Clinical Impression Statement: Pt continues to demonstrate good core contraction with supine stabilization exercises. Began supine isometric hip flexion and prone hip extension to improve hip strength with minimal difficulty.[place] continued this session as the modality appears to be decreasing pt's pain. Pt reports pain decrease to 0/10 at end of session. PT Plan: Continue to progress per PT POC.     Problem List Patient Active Problem List  Diagnosis  . Symptomatic cholelithiasis  . Difficulty in walking    PT - End of Session Activity Tolerance: Patient tolerated treatment well General Behavior During Session: [facility] for tasks performed Cognition: [facility] for tasks performed  GP Functional Assessment Tool Used: oswestry pain scale  Seth Bake, PTA 05/27/2012, 5:17 PM

## 2012-05-29 ENCOUNTER — Ambulatory Visit (HOSPITAL_COMMUNITY)
Admission: RE | Admit: 2012-05-29 | Discharge: 2012-05-29 | Disposition: A | Discharge status: Home / Self Care | Payer: Medicare Other | Source: Ambulatory Visit | Attending: Family Medicine | Admitting: Family Medicine

## 2012-05-29 DIAGNOSIS — R262 Difficulty in walking, not elsewhere classified: Secondary | ICD-10-CM

## 2012-05-29 NOTE — Progress Notes (Signed)
Physical Therapy Treatment Patient Details  Name: [name] MRN: [id number] Date of Birth: 08/10/1936  Today's Date: 05/29/2012 Time: 4098-1191 PT Time Calculation (min): 46 min  Visit#: 5  of 6   Re-eval: 06/01/12    Authorization:    Authorization Time Period:    Authorization Visit#:   of     Subjective: Symptoms/Limitations Symptoms: Pt states she has intermittent pain on the L SI Pain Assessment Currently in Pain?: Yes Pain Score:   3 Pain Location: Back     Exercise/Treatments     Stretches Active Hamstring Stretch: 3 reps;30 seconds Single Knee to Chest Stretch: 3 reps;30 seconds Standing Heel Raises: 10 reps Functional Squats: 15 reps   Supine Bent Knee Raise: 10 reps Bridge: 15 reps Sidelying Clam: 10 reps Hip Abduction: 10 reps Prone  Straight Leg Raise: 10 reps Quadruped    Modalities Modalities: Ultrasound Manual Therapy Manual Therapy: Joint mobilization Joint Mobilization: mm energy for SI  Ultrasound Ultrasound Location: L SI  Ultrasound Parameters: 1.5 w/cm2 @ 1 MG HZ x 8'  Physical Therapy Assessment and Plan PT Assessment and Plan Clinical Impression Statement: Pt demonstrated good alignment after mm energy technique pain decreased to 1/10.  Began SL ex to improve stability of hip and back PT Plan: Give pt new ex sheen for Home use; needs re-eval as presciption was for two weeks.  Begin opposite arm/leg raise next treatment      Problem List Patient Active Problem List  Diagnosis  . Symptomatic cholelithiasis  . Difficulty in walking    PT - End of Session Activity Tolerance: Patient tolerated treatment well General Behavior During Session: [facility] for tasks performed Cognition: [facility] for tasks performed  GP    [name] 05/29/2012, 2:07 PM

## 2012-06-01 ENCOUNTER — Ambulatory Visit (HOSPITAL_COMMUNITY)
Admission: RE | Admit: 2012-06-01 | Discharge: 2012-06-01 | Disposition: A | Discharge status: Home / Self Care | Payer: Medicare Other | Source: Ambulatory Visit | Attending: *Deleted | Admitting: *Deleted

## 2012-06-01 NOTE — Progress Notes (Signed)
Physical Therapy Re-evaluation  Patient Details  Name: [name] MRN: [id number] Date of Birth: [birth year]  Today's Date: 06/01/2012 Time: 1518-1600 PT Time Calculation (min): 42 min  Visit#: 6  of 14   Re-eval: 07/01/12 Charges: [place] x 8' Therex x 8' Self care x 20'  Authorization: medicare G code re-eval CK goal CI;  Goal based on oswestry pain scale    Past Medical History:  Past Medical History  Diagnosis Date  . Arthritis   . Hyperlipidemia   . Hypertension   . Sleep apnea     cpap 3 yrs no problems use 1/2 tx  . GERD (gastroesophageal reflux disease)     occ   Past Surgical History:  Past Surgical History  Procedure Date  . Total knee arthroplasty 01/23/2010/ 02/21/11    bil  . Abdominal hysterectomy 07/31/10  . Tonsillectomy   . Eye surgery     bil  . Cholecystectomy 04/24/2012    Procedure: LAPAROSCOPIC CHOLECYSTECTOMY;  Surgeon: Shelly Rubenstein, MD;  Location: [facility] OR;  Service: General;  Laterality: N/A;    Subjective Symptoms/Limitations Symptoms: Pt states that she is not having any pain, just a little discomfort. Pain Assessment Currently in Pain?: No/denies   Assessment RLE Strength RLE Overall Strength: Within Functional Limits for tasks assessed LLE Strength LLE Overall Strength: Within Functional Limits for tasks assessed Lumbar AROM Lumbar Flexion: wnl Lumbar - Right Side Bend: wfl Lumbar - Left Side Bend: wfl Lumbar - Right Rotation: wfl Lumbar - Left Rotation: wfl  Exercise/Treatments  Modalities Modalities: Ultrasound Manual Therapy Manual Therapy: Joint mobilization Ultrasound Ultrasound Location: L SI Ultrasound Parameters: 1.5 w/cm2 @ 1 MG HZ x 8'  Ultrasound Goals: Pain  Physical Therapy Assessment and Plan PT Assessment and Plan Clinical Impression Statement: Pt is progressing well with therapy but continues to be limited by pain. Pt's Oswestry score has improved 20%. Pt also reports that she feels she has improved 20  % since beginning therapy. Pt reports 0/10 pain at end of session. PT Plan: Recommend to PT to continue x 4 wks.    Goals Home Exercise Program Pt will Perform Home Exercise Program: Independently PT Short Term Goals Time to Complete Short Term Goals: 2 weeks PT Short Term Goal 1: Pain no greater than a 3 PT Short Term Goal 1 - Progress: Progressing toward goal (Pt's pain was a 4/10 yesterday at church) PT Short Term Goal 2: Pt able to stand/walk for over an hour without increased pain PT Short Term Goal 2 - Progress: Progressing toward goal (Pt can walk 15 minutes before having increased pain)  Problem List Patient Active Problem List  Diagnosis  . Symptomatic cholelithiasis  . Difficulty in walking    PT - End of Session Activity Tolerance: Patient tolerated treatment well General Behavior During Session: [facility] for tasks performed Cognition: [facility] for tasks performed  GP Functional Assessment Tool Used: oswestry pain scale  Mobility: Walking and Moving Around Current Status (N8295): At least 40 percent but less than 60 percent impaired, limited or restricted (Put in incorrectly at eval; was 60-80% impaired at eval) Mobility: Walking and Moving Around Goal Status [phone]): At least 1 percent but less than 20 percent impaired, limited or restricted  Seth Bake, PTA 06/01/2012, 6:35 PM  Physician Documentation Your signature is required to indicate approval of the treatment plan as stated above.  Please sign and either send electronically or make a copy of this report for your files and return this physician  signed original.   Please mark one 1.__approve of plan  2. ___approve of plan with the following conditions.   ______________________________                                                          _____________________ Physician Signature                                                                                                             Date

## 2012-06-03 ENCOUNTER — Ambulatory Visit (HOSPITAL_COMMUNITY): Payer: Medicare Other | Admitting: Physical Therapy

## 2012-06-04 ENCOUNTER — Ambulatory Visit (HOSPITAL_COMMUNITY)
Admission: RE | Admit: 2012-06-04 | Discharge: 2012-06-04 | Disposition: A | Discharge status: Home / Self Care | Payer: Medicare Other | Source: Ambulatory Visit | Attending: Orthopedic Surgery | Admitting: Orthopedic Surgery

## 2012-06-04 DIAGNOSIS — R262 Difficulty in walking, not elsewhere classified: Secondary | ICD-10-CM

## 2012-06-04 NOTE — Progress Notes (Signed)
Physical Therapy Treatment Patient Details  Name: [name] MRN: [id number] Date of Birth: January 07, 1936  Today's Date: 06/04/2012 Time: 1191-4782 PT Time Calculation (min): 40 min Charges: 10' Manual, 15' NMR, 15' TE Visit#: 7  of 14   Re-eval: 07/01/12    Authorization: medicare G code re-eval CK goal CI;  Goal based on oswestry pain scale  Authorization Time Period:    Authorization Visit#: 7  of 16    Subjective: Symptoms/Limitations Symptoms: I think I am doing pretty well.  The only thing is I feel like I have a big lump on this left side. Overall I feel like I am doing a lot better.  Pain Assessment Currently in Pain?: Yes Pain Score:   3 Pain Location: Back  Precautions/Restrictions     Exercise/Treatments Standing Scapular Retraction: Both;10 reps;Theraband Theraband Level (Scapular Retraction): Level 4 (Blue) Row: Both;10 reps;Theraband Theraband Level (Row): Level 4 (Blue) Shoulder Extension: Both;10 reps;Theraband Theraband Level (Shoulder Extension): Level 4 (Blue) Shoulder ADduction: Both;10 reps;Theraband Theraband Level (Shoulder Adduction): Level 4 (Blue) Other Standing Lumbar Exercises: Retro gait, tandem gait, heel walking, toe walking 2 RT each Seated Other Seated Lumbar Exercises: Heel and Toe Roll in and outs 5x10 sec holds Other Seated Lumbar Exercises: PFC - VC's for proper contraction 3x10 sec holds  Manual Therapy Other Manual Therapy: PRONE: SCS to L low back/hip region x2 w/100% release w/STM after to improve local circulation.  Pain 1/10  Physical Therapy Assessment and Plan PT Assessment and Plan Clinical Impression Statement: Pt had a significant reduction in hip and low back spasms after manual treatment today as well as reports of decreased pain.  Demonstrates most difficulty with balance activities and requires min A for retro gait. Added gait activities and t-band to strengthen low back and improve balance.  PT Plan: Cont to  progress per PT POC    Goals    Problem List Patient Active Problem List  Diagnosis  . Symptomatic cholelithiasis  . Difficulty in walking    PT - End of Session Activity Tolerance: Patient tolerated treatment well General Behavior During Session: [facility] for tasks performed Cognition: [facility] for tasks performed PT Plan of Care PT Home Exercise Plan: updated w: clams, heel and toe roll in and outs, PFC and TrA squeeze PT Patient Instructions: Hold purse on the right side to decrease pain on the left. Consulted and Agree with Plan of Care: Patient  GP Functional Assessment Tool Used: oswestry pain scale  Mobility: Walking and Moving Around Current Status (N5621): At least 40 percent but less than 60 percent impaired, limited or restricted (Put in incorrectly at eval; was 60-80% impaired at eval) Mobility: Walking and Moving Around Goal Status [phone]): At least 1 percent but less than 20 percent impaired, limited or restricted  [name] 06/04/2012, 3:40 PM

## 2012-06-05 ENCOUNTER — Ambulatory Visit (HOSPITAL_COMMUNITY): Payer: Medicare Other | Admitting: Physical Therapy

## 2012-06-08 ENCOUNTER — Ambulatory Visit (HOSPITAL_COMMUNITY)
Admission: RE | Admit: 2012-06-08 | Discharge: 2012-06-08 | Disposition: A | Discharge status: Home / Self Care | Payer: Medicare Other | Source: Ambulatory Visit | Attending: Orthopedic Surgery | Admitting: Orthopedic Surgery

## 2012-06-08 NOTE — Progress Notes (Signed)
Physical Therapy Treatment Patient Details  Name: IESHA SUMMERHILL MRN: [id number] Date of Birth: 1936/01/05  Today's Date: 06/08/2012 Time: 1520-1602 PT Time Calculation (min): 42 min  Visit#: 8  of 14   Re-eval: 07/01/12 Charges: Therex x 32' Manual x 8'  Authorization: medicare G code re-eval CK goal CI;  Goal based on oswestry pain scale  Authorization Visit#: 8  of 16    Subjective: Symptoms/Limitations Symptoms: I haven't had any nerve pain since Friday. (Three days ago) Pain Assessment Currently in Pain?: Yes Pain Score:   3 Pain Location: Back Pain Orientation: Left   Exercise/Treatments Standing Scapular Retraction: 15 reps Theraband Level (Scapular Retraction): Level 4 (Blue) Row: 15 reps Theraband Level (Row): Level 4 (Blue) Shoulder Extension: 15 reps Theraband Level (Shoulder Extension): Level 4 (Blue) Shoulder ADduction: 15 reps Theraband Level (Shoulder Adduction): Level 4 (Blue) Other Standing Lumbar Exercises: Retro gait, tandem gait, heel walking, toe walking 2 RT each Seated Other Seated Lumbar Exercises: Heel and Toe Roll in and outs 5x10 sec holds Other Seated Lumbar Exercises: PFC - VC's for proper contraction 5x10 sec holds Supine Bent Knee Raise: 10 reps  Manual Therapy Other Manual Therapy: PRONE: SCS to L low back/hip region x2 w/100% release  Physical Therapy Assessment and Plan PT Assessment and Plan Clinical Impression Statement: Pt requires multimodal cueing for proper form with scapular tband exercises. SCS completed again to L hip/lumbar to decrease spasms and pain. Pt reports pain decrease to 0/10 at end of session. Pt reports that she has most of her pain in the mornings. Pt advised to complete stretches in the morning before getting out of bed. PT Plan: Cont to progress per PT POC     Problem List Patient Active Problem List  Diagnosis  . Symptomatic cholelithiasis  . Difficulty in walking    PT - End of Session Activity  Tolerance: Patient tolerated treatment well General Behavior During Session: [facility] for tasks performed Cognition: [facility] for tasks performed  GP Functional Assessment Tool Used: oswestry pain scale  Mobility: Walking and Moving Around Current Status (W0981):  (Put in incorrectly at eval; was 60-80% impaired at eval)  Seth Bake, PTA 06/08/2012, 4:05 PM

## 2012-06-10 ENCOUNTER — Ambulatory Visit (HOSPITAL_COMMUNITY): Payer: Medicare Other | Admitting: *Deleted

## 2012-06-10 DIAGNOSIS — M5137 Other intervertebral disc degeneration, lumbosacral region: Secondary | ICD-10-CM | POA: Diagnosis not present

## 2012-06-12 ENCOUNTER — Ambulatory Visit (HOSPITAL_COMMUNITY)
Admission: RE | Admit: 2012-06-12 | Discharge: 2012-06-12 | Disposition: A | Discharge status: Home / Self Care | Payer: Medicare Other | Source: Ambulatory Visit | Attending: Family Medicine | Admitting: Family Medicine

## 2012-06-12 DIAGNOSIS — R262 Difficulty in walking, not elsewhere classified: Secondary | ICD-10-CM

## 2012-06-12 NOTE — Progress Notes (Signed)
Physical Therapy Treatment Patient Details  Name: [name] MRN: [id number] Date of Birth: Dec 19, 1935  Today's Date: 06/12/2012 Time: 8469-6295 PT Time Calculation (min): 42 min  Visit#: 9  of 12   Re-eval: 06/19/12 (for g code)    Authorization:    Authorization Time Period:    Authorization Visit#:   of     Subjective: Symptoms/Limitations Symptoms: My pain is very little maybe a one.  PT states she is  doing her ex at home  Pain Assessment Pain Score:   1 Pain Location: Back    Exercise/Treatments     Stretches Active Hamstring Stretch: 3 reps;30 seconds Single Knee to Chest Stretch: 3 reps;30 seconds Standing Scapular Retraction: 15 reps Theraband Level (Scapular Retraction): Level 4 (Blue) Row: 15 reps Theraband Level (Row): Level 4 (Blue) Shoulder Extension: 15 reps Theraband Level (Shoulder Extension): Level 4 (Blue) Shoulder ADduction: 15 reps Theraband Level (Shoulder Adduction): Level 4 (Blue) Other Standing Lumbar Exercises: Retro gait, tandem gait, heel walking, toe walking 2 RT each Seated Other Seated Lumbar Exercises: Heel and Toe Roll in and outs 5x10 sec holds Other Seated Lumbar Exercises: PFC - VC's for proper contraction 5x10 sec holds Supine Bent Knee Raise: 10 reps Bridge: 15 reps Straight Leg Raise: 10 reps;Limitations Straight Leg Raises Limitations: floating. Isometric Hip Flexion: 5 reps Sidelying Hip Abduction: 15 reps Prone  Straight Leg Raise: 10 reps Other Prone Lumbar Exercises: heel squeeze x 10     Physical Therapy Assessment and Plan PT Assessment and Plan Clinical Impression Statement: Pt given t-band for postural ex.  Added prone ex with verbal cuing for proper technique.    Goals    Problem List Patient Active Problem List  Diagnosis  . Symptomatic cholelithiasis  . Difficulty in walking    General Behavior During Session: [facility] for tasks performed Cognition: [facility] for tasks performed  GP     [place],CINDY 06/12/2012, 3:55 PM

## 2012-06-15 ENCOUNTER — Ambulatory Visit (HOSPITAL_COMMUNITY)
Admission: RE | Admit: 2012-06-15 | Discharge: 2012-06-15 | Disposition: A | Discharge status: Home / Self Care | Payer: Medicare Other | Source: Ambulatory Visit | Attending: Orthopedic Surgery | Admitting: Orthopedic Surgery

## 2012-06-15 NOTE — Progress Notes (Signed)
Physical Therapy Treatment Patient Details  Name: [name] MRN: [id number] Date of Birth: 07-04-1936  Today's Date: 06/15/2012 Time: 0865-7846 PT Time Calculation (min): 47 min Visit#: 10  of 12   Re-eval: 06/19/12 (for G code recorded 06/12/12 by CR) Charges: therex 32', NMR 12'    Subjective: Symptoms/Limitations Symptoms: Pt. states her back is doing much better; currently 1/10.  States she is compliant with HEP. Pain Assessment Currently in Pain?: Yes Pain Score:   1 Pain Location: Back Pain Orientation: Left   Exercise/Treatments Stretches Active Hamstring Stretch: 3 reps;30 seconds Single Knee to Chest Stretch: 3 reps;30 seconds Standing Scapular Retraction: 15 reps Theraband Level (Scapular Retraction): Level 4 (Blue) Row: 15 reps Theraband Level (Row): Level 4 (Blue) Shoulder Extension: 15 reps Theraband Level (Shoulder Extension): Level 4 (Blue) Shoulder ADduction: 15 reps Theraband Level (Shoulder Adduction): Level 4 (Blue) Other Standing Lumbar Exercises: Retro gait, tandem gait, heel walking, toe walking 2 RT each Supine Bent Knee Raise: 10 reps Bridge: 15 reps Straight Leg Raise: 10 reps;Limitations Straight Leg Raises Limitations: floating. Isometric Hip Flexion: 10 reps;5 seconds Sidelying Hip Abduction: 15 reps Prone  Straight Leg Raise: 10 reps Other Prone Lumbar Exercises: heel squeeze x 10    Physical Therapy Assessment and Plan PT Assessment and Plan Clinical Impression Statement: Pt. with improving balance and form with therex.  Overall improving with increasing strength and decreasing pain. PT Plan: Progress tandem gait to balance beam next visit; Re-eval/G-Codes X 2 more visits.     Problem List Patient Active Problem List  Diagnosis  . Symptomatic cholelithiasis  . Difficulty in walking    Lurena Nida, PTA/CLT 06/15/2012, 4:36 PM

## 2012-06-17 ENCOUNTER — Ambulatory Visit (HOSPITAL_COMMUNITY)
Admission: RE | Admit: 2012-06-17 | Discharge: 2012-06-17 | Disposition: A | Discharge status: Home / Self Care | Payer: Medicare Other | Source: Ambulatory Visit | Attending: Orthopedic Surgery | Admitting: Orthopedic Surgery

## 2012-06-17 DIAGNOSIS — R262 Difficulty in walking, not elsewhere classified: Secondary | ICD-10-CM

## 2012-06-17 NOTE — Evaluation (Signed)
Physical Therapy Re-evaluation  Patient Details  Name: [name] MRN: [id number] Date of Birth: November 11, 1936  Today's Date: 06/17/2012 Time: 4098-1191 PT Time Calculation (min): 33 min  Visit#: 11  of 12   Re-eval:   Assessment Diagnosis: instability of low back Next MD Visit: 05/26/2012  Authorization:  medicare  Past Medical History:  Past Medical History  Diagnosis Date  . Arthritis   . Hyperlipidemia   . Hypertension   . Sleep apnea     cpap 3 yrs no problems use 1/2 tx  . GERD (gastroesophageal reflux disease)     occ   Past Surgical History:  Past Surgical History  Procedure Date  . Total knee arthroplasty 01/23/2010/ 02/21/11    bil  . Abdominal hysterectomy 07/31/10  . Tonsillectomy   . Eye surgery     bil  . Cholecystectomy 04/24/2012    Procedure: LAPAROSCOPIC CHOLECYSTECTOMY;  Surgeon: Shelly Rubenstein, MD;  Location: [facility] OR;  Service: General;  Laterality: N/A;    Subjective Symptoms/Limitations Symptoms: [name] states her back is feeling good.  She is doing all her normal activities. How long can you sit comfortably?: The patient is able to sit for 2-3 hrs.without pain. How long can you stand comfortably?: The patient states that she can stand for 30 minutes was 30 minutes. How long can you walk comfortably?: The patient is able to walk without the need of a cane.  At the end of the ten minute walk the pt stated her legs were tired.  Encouraged pt to start a walking program. Pain Assessment Pain Score: 0-No pain Pain Location: Back   Prior Functioning  Home Living Lives With: Family Prior Function Level of Independence: Independent with basic ADLs Driving: Yes Vocation: Retired Leisure: Hobbies-yes (Comment)  Cognition/Observation Cognition Overall Cognitive Status: Appears within functional limits for tasks assessed  Sensation/Coordination/Flexibility/Functional Tests Functional Tests Functional Tests: oswestry was 32 now 9  0= no  back disability.  Assessment RLE Strength RLE Overall Strength: Within Functional Limits for tasks assessed LLE Strength LLE Overall Strength: Within Functional Limits for tasks assessed Lumbar AROM Lumbar Flexion: wnl Lumbar Extension:  (Pt stands 20 degrees flexed.  Ext decreased 25%) Lumbar - Right Side Bend: wfl Lumbar - Left Side Bend: wfl Lumbar - Right Rotation: wfl Lumbar - Left Rotation: wfl Lumbar Strength Overall Lumbar Strength:  (3+/5) Palpation Palpation: illiar crest, ASIS and PSIS are even R side was high. (L ASIS high; L iliac crest high, L SI low with tenderness )  Exercise/Treatments Mobility/Balance   Ambulated x ten minutes with emphasis on standing tall and engaging stomach mm.    Counseled pt on the benefits of walking and the importance of walking every day.  Reviewed exercises.  Pt has no questions on HEP    Physical Therapy Assessment and Plan    Pt able to do all normal activity without pain.  Discharge patient.  Goals Home Exercise Program PT Goal: Perform Home Exercise Program - Progress: Met PT Short Term Goals PT Short Term Goal 1 - Progress: Met PT Short Term Goal 2: Pt is able to stand for 30 minutes has no increased pain but does have increased stiffness.  After walking for 10 minutes with therapist pt stated she wanted to stop secondary to her legs being tired but no back pain. PT Short Term Goal 2 - Progress: Progressing toward goal  Problem List Patient Active Problem List  Diagnosis  . Symptomatic cholelithiasis  . Difficulty in walking  GP Functional Assessment Tool Used: oswestry pain scale Functional Limitation: Mobility: Walking and moving around Mobility: Walking and Moving Around Goal Status [phone]): At least 1 percent but less than 20 percent impaired, limited or restricted Mobility: Walking and Moving Around Discharge Status [phone]): At least 1 percent but less than 20 percent impaired, limited or  restricted  RUSSELL,CINDY 06/17/2012, 3:51 PM  Physician Documentation Your signature is required to indicate approval of the treatment plan as stated above.  Please sign and either send electronically or make a copy of this report for your files and return this physician signed original.   Please mark one 1.__approve of plan  2. ___approve of plan with the following conditions.   ______________________________                                                          _____________________ Physician Signature                                                                                                             Date

## 2012-06-19 ENCOUNTER — Ambulatory Visit (HOSPITAL_COMMUNITY): Payer: Medicare Other | Admitting: Physical Therapy

## 2012-09-02 DIAGNOSIS — Z23 Encounter for immunization: Secondary | ICD-10-CM | POA: Diagnosis not present

## 2012-09-02 DIAGNOSIS — M5137 Other intervertebral disc degeneration, lumbosacral region: Secondary | ICD-10-CM | POA: Diagnosis not present

## 2012-09-02 DIAGNOSIS — I10 Essential (primary) hypertension: Secondary | ICD-10-CM | POA: Diagnosis not present

## 2012-11-04 ENCOUNTER — Other Ambulatory Visit (HOSPITAL_COMMUNITY): Payer: Self-pay | Admitting: Family Medicine

## 2012-11-04 DIAGNOSIS — Z09 Encounter for follow-up examination after completed treatment for conditions other than malignant neoplasm: Secondary | ICD-10-CM

## 2012-11-16 ENCOUNTER — Ambulatory Visit (HOSPITAL_COMMUNITY): Payer: Medicare Other

## 2012-11-16 DIAGNOSIS — J019 Acute sinusitis, unspecified: Secondary | ICD-10-CM | POA: Diagnosis not present

## 2012-11-30 DIAGNOSIS — M5137 Other intervertebral disc degeneration, lumbosacral region: Secondary | ICD-10-CM | POA: Diagnosis not present

## 2013-01-20 DIAGNOSIS — E669 Obesity, unspecified: Secondary | ICD-10-CM | POA: Diagnosis not present

## 2013-01-20 DIAGNOSIS — E7800 Pure hypercholesterolemia, unspecified: Secondary | ICD-10-CM | POA: Diagnosis not present

## 2013-01-20 DIAGNOSIS — E781 Pure hyperglyceridemia: Secondary | ICD-10-CM | POA: Diagnosis not present

## 2013-01-20 DIAGNOSIS — I10 Essential (primary) hypertension: Secondary | ICD-10-CM | POA: Diagnosis not present

## 2013-01-20 DIAGNOSIS — E119 Type 2 diabetes mellitus without complications: Secondary | ICD-10-CM | POA: Diagnosis not present

## 2013-02-22 DIAGNOSIS — M5137 Other intervertebral disc degeneration, lumbosacral region: Secondary | ICD-10-CM | POA: Diagnosis not present

## 2013-03-01 ENCOUNTER — Ambulatory Visit (HOSPITAL_COMMUNITY)
Admission: RE | Admit: 2013-03-01 | Discharge: 2013-03-01 | Disposition: A | Discharge status: Home / Self Care | Payer: Medicare Other | Source: Ambulatory Visit | Attending: Family Medicine | Admitting: Family Medicine

## 2013-03-01 DIAGNOSIS — Z1231 Encounter for screening mammogram for malignant neoplasm of breast: Secondary | ICD-10-CM | POA: Insufficient documentation

## 2013-03-01 DIAGNOSIS — Z09 Encounter for follow-up examination after completed treatment for conditions other than malignant neoplasm: Secondary | ICD-10-CM

## 2013-03-23 DIAGNOSIS — Z1211 Encounter for screening for malignant neoplasm of colon: Secondary | ICD-10-CM | POA: Diagnosis not present

## 2013-03-23 DIAGNOSIS — Z86010 Personal history of colonic polyps: Secondary | ICD-10-CM | POA: Diagnosis not present

## 2013-03-23 DIAGNOSIS — R635 Abnormal weight gain: Secondary | ICD-10-CM | POA: Diagnosis not present

## 2013-03-23 DIAGNOSIS — K648 Other hemorrhoids: Secondary | ICD-10-CM | POA: Diagnosis not present

## 2013-04-21 DIAGNOSIS — D126 Benign neoplasm of colon, unspecified: Secondary | ICD-10-CM | POA: Diagnosis not present

## 2013-04-21 DIAGNOSIS — Z86010 Personal history of colonic polyps: Secondary | ICD-10-CM | POA: Diagnosis not present

## 2013-04-21 DIAGNOSIS — K6389 Other specified diseases of intestine: Secondary | ICD-10-CM | POA: Diagnosis not present

## 2013-04-21 DIAGNOSIS — Z1211 Encounter for screening for malignant neoplasm of colon: Secondary | ICD-10-CM | POA: Diagnosis not present

## 2013-05-20 DIAGNOSIS — I10 Essential (primary) hypertension: Secondary | ICD-10-CM | POA: Diagnosis not present

## 2013-05-20 DIAGNOSIS — E7800 Pure hypercholesterolemia, unspecified: Secondary | ICD-10-CM | POA: Diagnosis not present

## 2013-05-20 DIAGNOSIS — E119 Type 2 diabetes mellitus without complications: Secondary | ICD-10-CM | POA: Diagnosis not present

## 2013-05-24 DIAGNOSIS — M5137 Other intervertebral disc degeneration, lumbosacral region: Secondary | ICD-10-CM | POA: Diagnosis not present

## 2013-08-23 DIAGNOSIS — G5780 Other specified mononeuropathies of unspecified lower limb: Secondary | ICD-10-CM | POA: Diagnosis not present

## 2013-09-13 DIAGNOSIS — Z23 Encounter for immunization: Secondary | ICD-10-CM | POA: Diagnosis not present

## 2013-09-17 DIAGNOSIS — I10 Essential (primary) hypertension: Secondary | ICD-10-CM | POA: Diagnosis not present

## 2013-09-17 DIAGNOSIS — E7800 Pure hypercholesterolemia, unspecified: Secondary | ICD-10-CM | POA: Diagnosis not present

## 2013-09-17 DIAGNOSIS — E119 Type 2 diabetes mellitus without complications: Secondary | ICD-10-CM | POA: Diagnosis not present

## 2013-09-27 DIAGNOSIS — R0602 Shortness of breath: Secondary | ICD-10-CM | POA: Diagnosis not present

## 2013-10-01 DIAGNOSIS — I10 Essential (primary) hypertension: Secondary | ICD-10-CM | POA: Diagnosis not present

## 2013-10-25 DIAGNOSIS — M5137 Other intervertebral disc degeneration, lumbosacral region: Secondary | ICD-10-CM | POA: Diagnosis not present

## 2014-01-21 DIAGNOSIS — M5137 Other intervertebral disc degeneration, lumbosacral region: Secondary | ICD-10-CM | POA: Diagnosis not present

## 2014-01-24 ENCOUNTER — Other Ambulatory Visit (HOSPITAL_COMMUNITY): Payer: Self-pay | Admitting: Family Medicine

## 2014-01-24 DIAGNOSIS — Z1231 Encounter for screening mammogram for malignant neoplasm of breast: Secondary | ICD-10-CM

## 2014-02-07 DIAGNOSIS — E781 Pure hyperglyceridemia: Secondary | ICD-10-CM | POA: Diagnosis not present

## 2014-02-07 DIAGNOSIS — E7800 Pure hypercholesterolemia, unspecified: Secondary | ICD-10-CM | POA: Diagnosis not present

## 2014-02-07 DIAGNOSIS — I10 Essential (primary) hypertension: Secondary | ICD-10-CM | POA: Diagnosis not present

## 2014-03-03 ENCOUNTER — Ambulatory Visit (HOSPITAL_COMMUNITY)
Admission: RE | Admit: 2014-03-03 | Discharge: 2014-03-03 | Disposition: A | Discharge status: Home / Self Care | Payer: Medicare Other | Source: Ambulatory Visit | Attending: Family Medicine | Admitting: Family Medicine

## 2014-03-03 DIAGNOSIS — Z1231 Encounter for screening mammogram for malignant neoplasm of breast: Secondary | ICD-10-CM | POA: Insufficient documentation

## 2014-03-08 ENCOUNTER — Other Ambulatory Visit: Payer: Self-pay | Admitting: Family Medicine

## 2014-03-08 DIAGNOSIS — R928 Other abnormal and inconclusive findings on diagnostic imaging of breast: Secondary | ICD-10-CM

## 2014-03-23 ENCOUNTER — Ambulatory Visit (HOSPITAL_COMMUNITY)
Admission: RE | Admit: 2014-03-23 | Discharge: 2014-03-23 | Disposition: A | Discharge status: Home / Self Care | Payer: Medicare Other | Source: Ambulatory Visit | Attending: Family Medicine | Admitting: Family Medicine

## 2014-03-23 ENCOUNTER — Other Ambulatory Visit: Payer: Self-pay | Admitting: Family Medicine

## 2014-03-23 DIAGNOSIS — N6009 Solitary cyst of unspecified breast: Secondary | ICD-10-CM | POA: Diagnosis not present

## 2014-03-23 DIAGNOSIS — R928 Other abnormal and inconclusive findings on diagnostic imaging of breast: Secondary | ICD-10-CM

## 2014-04-29 DIAGNOSIS — M5137 Other intervertebral disc degeneration, lumbosacral region: Secondary | ICD-10-CM | POA: Diagnosis not present

## 2014-06-07 DIAGNOSIS — M129 Arthropathy, unspecified: Secondary | ICD-10-CM | POA: Diagnosis not present

## 2014-06-07 DIAGNOSIS — E7800 Pure hypercholesterolemia, unspecified: Secondary | ICD-10-CM | POA: Diagnosis not present

## 2014-06-07 DIAGNOSIS — E669 Obesity, unspecified: Secondary | ICD-10-CM | POA: Diagnosis not present

## 2014-06-07 DIAGNOSIS — I10 Essential (primary) hypertension: Secondary | ICD-10-CM | POA: Diagnosis not present

## 2014-06-07 DIAGNOSIS — E119 Type 2 diabetes mellitus without complications: Secondary | ICD-10-CM | POA: Diagnosis not present

## 2014-07-29 DIAGNOSIS — M5137 Other intervertebral disc degeneration, lumbosacral region: Secondary | ICD-10-CM | POA: Diagnosis not present

## 2014-08-24 DIAGNOSIS — Z23 Encounter for immunization: Secondary | ICD-10-CM | POA: Diagnosis not present

## 2014-09-30 DIAGNOSIS — M5136 Other intervertebral disc degeneration, lumbar region: Secondary | ICD-10-CM | POA: Diagnosis not present

## 2014-10-04 DIAGNOSIS — I10 Essential (primary) hypertension: Secondary | ICD-10-CM | POA: Diagnosis not present

## 2014-10-04 DIAGNOSIS — E119 Type 2 diabetes mellitus without complications: Secondary | ICD-10-CM | POA: Diagnosis not present

## 2014-10-10 DIAGNOSIS — R600 Localized edema: Secondary | ICD-10-CM | POA: Diagnosis not present

## 2014-10-10 DIAGNOSIS — M1250 Traumatic arthropathy, unspecified site: Secondary | ICD-10-CM | POA: Diagnosis not present

## 2014-10-10 DIAGNOSIS — I10 Essential (primary) hypertension: Secondary | ICD-10-CM | POA: Diagnosis not present

## 2014-10-26 DIAGNOSIS — M1250 Traumatic arthropathy, unspecified site: Secondary | ICD-10-CM | POA: Diagnosis not present

## 2014-10-26 DIAGNOSIS — R351 Nocturia: Secondary | ICD-10-CM | POA: Diagnosis not present

## 2014-10-26 DIAGNOSIS — I10 Essential (primary) hypertension: Secondary | ICD-10-CM | POA: Diagnosis not present

## 2014-10-26 DIAGNOSIS — R600 Localized edema: Secondary | ICD-10-CM | POA: Diagnosis not present

## 2014-11-03 DIAGNOSIS — Z961 Presence of intraocular lens: Secondary | ICD-10-CM | POA: Diagnosis not present

## 2014-11-03 DIAGNOSIS — H2513 Age-related nuclear cataract, bilateral: Secondary | ICD-10-CM | POA: Diagnosis not present

## 2014-12-02 DIAGNOSIS — H9209 Otalgia, unspecified ear: Secondary | ICD-10-CM | POA: Diagnosis not present

## 2014-12-02 DIAGNOSIS — J069 Acute upper respiratory infection, unspecified: Secondary | ICD-10-CM | POA: Diagnosis not present

## 2014-12-30 DIAGNOSIS — M5136 Other intervertebral disc degeneration, lumbar region: Secondary | ICD-10-CM | POA: Diagnosis not present

## 2015-02-07 DIAGNOSIS — I10 Essential (primary) hypertension: Secondary | ICD-10-CM | POA: Diagnosis not present

## 2015-02-07 DIAGNOSIS — R351 Nocturia: Secondary | ICD-10-CM | POA: Diagnosis not present

## 2015-02-07 DIAGNOSIS — M1250 Traumatic arthropathy, unspecified site: Secondary | ICD-10-CM | POA: Diagnosis not present

## 2015-02-20 DIAGNOSIS — I10 Essential (primary) hypertension: Secondary | ICD-10-CM | POA: Diagnosis not present

## 2015-02-28 DIAGNOSIS — I10 Essential (primary) hypertension: Secondary | ICD-10-CM | POA: Diagnosis not present

## 2015-02-28 DIAGNOSIS — N189 Chronic kidney disease, unspecified: Secondary | ICD-10-CM | POA: Diagnosis not present

## 2015-02-28 DIAGNOSIS — R600 Localized edema: Secondary | ICD-10-CM | POA: Diagnosis not present

## 2015-03-03 DIAGNOSIS — M5136 Other intervertebral disc degeneration, lumbar region: Secondary | ICD-10-CM | POA: Diagnosis not present

## 2015-03-31 DIAGNOSIS — M2550 Pain in unspecified joint: Secondary | ICD-10-CM | POA: Diagnosis not present

## 2015-03-31 DIAGNOSIS — I10 Essential (primary) hypertension: Secondary | ICD-10-CM | POA: Diagnosis not present

## 2015-04-03 ENCOUNTER — Other Ambulatory Visit (HOSPITAL_COMMUNITY): Payer: Self-pay | Admitting: Family Medicine

## 2015-04-03 DIAGNOSIS — Z1231 Encounter for screening mammogram for malignant neoplasm of breast: Secondary | ICD-10-CM

## 2015-04-20 DIAGNOSIS — M2550 Pain in unspecified joint: Secondary | ICD-10-CM | POA: Diagnosis not present

## 2015-04-20 DIAGNOSIS — I10 Essential (primary) hypertension: Secondary | ICD-10-CM | POA: Diagnosis not present

## 2015-04-24 ENCOUNTER — Ambulatory Visit (HOSPITAL_COMMUNITY)
Admission: RE | Admit: 2015-04-24 | Discharge: 2015-04-24 | Disposition: A | Discharge status: Home / Self Care | Payer: Medicare Other | Source: Ambulatory Visit | Attending: Family Medicine | Admitting: Family Medicine

## 2015-04-24 DIAGNOSIS — Z1231 Encounter for screening mammogram for malignant neoplasm of breast: Secondary | ICD-10-CM | POA: Insufficient documentation

## 2015-06-01 DIAGNOSIS — M1250 Traumatic arthropathy, unspecified site: Secondary | ICD-10-CM | POA: Diagnosis not present

## 2015-06-01 DIAGNOSIS — I10 Essential (primary) hypertension: Secondary | ICD-10-CM | POA: Diagnosis not present

## 2015-06-01 DIAGNOSIS — G4762 Sleep related leg cramps: Secondary | ICD-10-CM | POA: Diagnosis not present

## 2015-06-01 DIAGNOSIS — N189 Chronic kidney disease, unspecified: Secondary | ICD-10-CM | POA: Diagnosis not present

## 2015-06-02 DIAGNOSIS — I739 Peripheral vascular disease, unspecified: Secondary | ICD-10-CM | POA: Diagnosis not present

## 2015-07-13 DIAGNOSIS — N189 Chronic kidney disease, unspecified: Secondary | ICD-10-CM | POA: Diagnosis not present

## 2015-07-13 DIAGNOSIS — R600 Localized edema: Secondary | ICD-10-CM | POA: Diagnosis not present

## 2015-07-13 DIAGNOSIS — I10 Essential (primary) hypertension: Secondary | ICD-10-CM | POA: Diagnosis not present

## 2015-07-13 DIAGNOSIS — G4762 Sleep related leg cramps: Secondary | ICD-10-CM | POA: Diagnosis not present

## 2015-07-14 DIAGNOSIS — I739 Peripheral vascular disease, unspecified: Secondary | ICD-10-CM | POA: Diagnosis not present

## 2015-08-25 DIAGNOSIS — M5136 Other intervertebral disc degeneration, lumbar region: Secondary | ICD-10-CM | POA: Diagnosis not present

## 2015-08-29 DIAGNOSIS — Z23 Encounter for immunization: Secondary | ICD-10-CM | POA: Diagnosis not present

## 2015-10-11 DIAGNOSIS — J019 Acute sinusitis, unspecified: Secondary | ICD-10-CM | POA: Diagnosis not present

## 2015-12-13 ENCOUNTER — Ambulatory Visit (INDEPENDENT_AMBULATORY_CARE_PROVIDER_SITE_OTHER): Payer: Medicare Other | Admitting: Emergency Medicine

## 2015-12-13 VITALS — BP 162/70 | HR 68 | Temp 97.8°F | Resp 16 | Ht 67.0 in | Wt 257.0 lb

## 2015-12-13 DIAGNOSIS — H578 Other specified disorders of eye and adnexa: Secondary | ICD-10-CM | POA: Diagnosis not present

## 2015-12-13 DIAGNOSIS — R030 Elevated blood-pressure reading, without diagnosis of hypertension: Secondary | ICD-10-CM

## 2015-12-13 DIAGNOSIS — H5789 Other specified disorders of eye and adnexa: Secondary | ICD-10-CM

## 2015-12-13 DIAGNOSIS — R601 Generalized edema: Secondary | ICD-10-CM | POA: Diagnosis not present

## 2015-12-13 DIAGNOSIS — IMO0001 Reserved for inherently not codable concepts without codable children: Secondary | ICD-10-CM

## 2015-12-13 LAB — POCT URINALYSIS DIP (MANUAL ENTRY)
Bilirubin, UA: NEGATIVE
Glucose, UA: NEGATIVE
Ketones, POC UA: NEGATIVE
Leukocytes, UA: NEGATIVE
NITRITE UA: NEGATIVE
PH UA: 5
Protein Ur, POC: NEGATIVE
RBC UA: NEGATIVE
SPEC GRAV UA: 1.01
UROBILINOGEN UA: 0.2

## 2015-12-13 LAB — POCT CBC
GRANULOCYTE PERCENT: 73.2 % (ref 37–80)
HCT, POC: 39.3 % (ref 37.7–47.9)
HEMOGLOBIN: 13.9 g/dL (ref 12.2–16.2)
Lymph, poc: 1.9 (ref 0.6–3.4)
MCH: 32 pg — AB (ref 27–31.2)
MCHC: 35.3 g/dL (ref 31.8–35.4)
MCV: 90.7 fL (ref 80–97)
MID (cbc): 0.7 (ref 0–0.9)
MPV: 7.8 fL (ref 0–99.8)
PLATELET COUNT, POC: 205 10*3/uL (ref 142–424)
POC Granulocyte: 7 — AB (ref 2–6.9)
POC LYMPH PERCENT: 20 %L (ref 10–50)
POC MID %: 6.8 %M (ref 0–12)
RBC: 4.32 M/uL (ref 4.04–5.48)
RDW, POC: 13.3 %
WBC: 9.6 10*3/uL (ref 4.6–10.2)

## 2015-12-13 MED ORDER — TRIAMTERENE-HCTZ 37.5-25 MG PO TABS: 1.0000 | ORAL_TABLET | Freq: Every day | ORAL | Status: DC

## 2015-12-13 MED ORDER — AMLODIPINE BESYLATE 10 MG PO TABS: 10.0000 mg | ORAL_TABLET | Freq: Every day | ORAL | Status: AC

## 2015-12-13 MED ORDER — POTASSIUM CHLORIDE CRYS ER 20 MEQ PO TBCR: 20.0000 meq | EXTENDED_RELEASE_TABLET | Freq: Every day | ORAL | Status: DC

## 2015-12-13 MED ORDER — NAPHAZOLINE HCL 0.1 % OP SOLN: 1.0000 [drp] | Freq: Four times a day (QID) | OPHTHALMIC | Status: DC | PRN

## 2015-12-13 NOTE — Patient Instructions (Addendum)
DASH Eating Plan DASH stands for "Dietary Approaches to Stop Hypertension." The DASH eating plan is a healthy eating plan that has been shown to reduce high blood pressure (hypertension). Additional health benefits may include reducing the risk of type 2 diabetes mellitus, heart disease, and stroke. The DASH eating plan may also help with weight loss. WHAT DO I NEED TO KNOW ABOUT THE DASH EATING PLAN? For the DASH eating plan, you will follow these general guidelines:  Choose foods with a percent daily value for sodium of less than 5% (as listed on the food label).  Use salt-free seasonings or herbs instead of table salt or sea salt.  Check with your health care provider or pharmacist before using salt substitutes.  Eat lower-sodium products, often labeled as "lower sodium" or "no salt added."  Eat fresh foods.  Eat more vegetables, fruits, and low-fat dairy products.  Choose whole grains. Look for the word "whole" as the first word in the ingredient list.  Choose fish and skinless chicken or turkey more often than red meat. Limit fish, poultry, and meat to 6 oz (170 g) each day.  Limit sweets, desserts, sugars, and sugary drinks.  Choose heart-healthy fats.  Limit cheese to 1 oz (28 g) per day.  Eat more home-cooked food and less restaurant, buffet, and fast food.  Limit fried foods.  Cook foods using methods other than frying.  Limit canned vegetables. If you do use them, rinse them well to decrease the sodium.  When eating at a restaurant, ask that your food be prepared with less salt, or no salt if possible. WHAT FOODS CAN I EAT? Seek help from a dietitian for individual calorie needs. Grains Whole grain or whole wheat bread. Brown rice. Whole grain or whole wheat pasta. Quinoa, bulgur, and whole grain cereals. Low-sodium cereals. Corn or whole wheat flour tortillas. Whole grain cornbread. Whole grain crackers. Low-sodium crackers. Vegetables Fresh or frozen vegetables  (raw, steamed, roasted, or grilled). Low-sodium or reduced-sodium tomato and vegetable juices. Low-sodium or reduced-sodium tomato sauce and paste. Low-sodium or reduced-sodium canned vegetables.  Fruits All fresh, canned (in natural juice), or frozen fruits. Meat and Other Protein Products Ground beef (85% or leaner), grass-fed beef, or beef trimmed of fat. Skinless chicken or turkey. Ground chicken or turkey. Pork trimmed of fat. All fish and seafood. Eggs. Dried beans, peas, or lentils. Unsalted nuts and seeds. Unsalted canned beans. Dairy Low-fat dairy products, such as skim or 1% milk, 2% or reduced-fat cheeses, low-fat ricotta or cottage cheese, or plain low-fat yogurt. Low-sodium or reduced-sodium cheeses. Fats and Oils Tub margarines without trans fats. Light or reduced-fat mayonnaise and salad dressings (reduced sodium). Avocado. Safflower, olive, or canola oils. Natural peanut or almond butter. Other Unsalted popcorn and pretzels. The items listed above may not be a complete list of recommended foods or beverages. Contact your dietitian for more options. WHAT FOODS ARE NOT RECOMMENDED? Grains White bread. White pasta. White rice. Refined cornbread. Bagels and croissants. Crackers that contain trans fat. Vegetables Creamed or fried vegetables. Vegetables in a cheese sauce. Regular canned vegetables. Regular canned tomato sauce and paste. Regular tomato and vegetable juices. Fruits Dried fruits. Canned fruit in light or heavy syrup. Fruit juice. Meat and Other Protein Products Fatty cuts of meat. Ribs, chicken wings, bacon, sausage, bologna, salami, chitterlings, fatback, hot dogs, bratwurst, and packaged luncheon meats. Salted nuts and seeds. Canned beans with salt. Dairy Whole or 2% milk, cream, half-and-half, and cream cheese. Whole-fat or sweetened yogurt. Full-fat   cheeses or blue cheese. Nondairy creamers and whipped toppings. Processed cheese, cheese spreads, or cheese  curds. Condiments Onion and garlic salt, seasoned salt, table salt, and sea salt. Canned and packaged gravies. Worcestershire sauce. Tartar sauce. Barbecue sauce. Teriyaki sauce. Soy sauce, including reduced sodium. Steak sauce. Fish sauce. Oyster sauce. Cocktail sauce. Horseradish. Ketchup and mustard. Meat flavorings and tenderizers. Bouillon cubes. Hot sauce. Tabasco sauce. Marinades. Taco seasonings. Relishes. Fats and Oils Butter, stick margarine, lard, shortening, ghee, and bacon fat. Coconut, palm kernel, or palm oils. Regular salad dressings. Other Pickles and olives. Salted popcorn and pretzels. The items listed above may not be a complete list of foods and beverages to avoid. Contact your dietitian for more information. WHERE CAN I FIND MORE INFORMATION? National Heart, Lung, and Blood Institute: travelstabloid.com   This information is not intended to replace advice given to you by your health care provider. Make sure you discuss any questions you have with your health care provider.   Document Released: 10/24/2011 Document Revised: 11/25/2014 Document Reviewed: 09/08/2013 Elsevier Interactive Patient Education 2016 Else[employer] Edema Edema is an abnormal buildup of fluids in your bodytissues. Edema is somewhatdependent on gravity to pull the fluid to the lowest place in your body. That makes the condition more common in the legs and thighs (lower extremities). Painless swelling of the feet and ankles is common and becomes more likely as you get older. It is also common in looser tissues, like around your eyes.  When the affected area is squeezed, the fluid may move out of that spot and leave a dent for a few moments. This dent is called pitting.  CAUSES  There are many possible causes of edema. Eating too much salt and being on your feet or sitting for a long time can cause edema in your legs and ankles. Hot weather may make edema worse. Common  medical causes of edema include:  Heart failure.  Liver disease.  Kidney disease.  Weak blood vessels in your legs.  Cancer.  An injury.  Pregnancy.  Some medications.  Obesity. SYMPTOMS  Edema is usually painless.Your skin may look swollen or shiny.  DIAGNOSIS  Your health care provider may be able to diagnose edema by asking about your medical history and doing a physical exam. You may need to have tests such as X-rays, an electrocardiogram, or blood tests to check for medical conditions that may cause edema.  TREATMENT  Edema treatment depends on the cause. If you have heart, liver, or kidney disease, you need the treatment appropriate for these conditions. General treatment may include:  Elevation of the affected body part above the level of your heart.  Compression of the affected body part. Pressure from elastic bandages or support stockings squeezes the tissues and forces fluid back into the blood vessels. This keeps fluid from entering the tissues.  Restriction of fluid and salt intake.  Use of a water pill (diuretic). These medications are appropriate only for some types of edema. They pull fluid out of your body and make you urinate more often. This gets rid of fluid and reduces swelling, but diuretics can have side effects. Only use diuretics as directed by your health care provider. HOME CARE INSTRUCTIONS   Keep the affected body part above the level of your heart when you are lying down.   Do not sit still or stand for prolonged periods.   Do not put anything directly under your knees when lying down.  Do not wear constricting clothing  or garters on your upper legs.   Exercise your legs to work the fluid back into your blood vessels. This may help the swelling go down.   Wear elastic bandages or support stockings to reduce ankle swelling as directed by your health care provider.   Eat a low-salt diet to reduce fluid if your health care provider  recommends it.   Only take medicines as directed by your health care provider. SEEK MEDICAL CARE IF:   Your edema is not responding to treatment.  You have heart, liver, or kidney disease and notice symptoms of edema.  You have edema in your legs that does not improve after elevating them.   You have sudden and unexplained weight gain. SEEK IMMEDIATE MEDICAL CARE IF:   You develop shortness of breath or chest pain.   You cannot breathe when you lie down.  You develop pain, redness, or warmth in the swollen areas.   You have heart, liver, or kidney disease and suddenly get edema.  You have a fever and your symptoms suddenly get worse. MAKE SURE YOU:   Understand these instructions.  Will watch your condition.  Will get help right away if you are not doing well or get worse.   This information is not intended to replace advice given to you by your health care provider. Make sure you discuss any questions you have with your health care provider.   Document Released: 11/04/2005 Document Revised: 11/25/2014 Document Reviewed: 08/27/2013 Elsevier Interactive Patient Education [employer].

## 2015-12-13 NOTE — Progress Notes (Addendum)
Patient ID: [name], female   DOB: 1935-12-21, 80 y.o.   MRN: [id number]    By signing my name below, I, Essence Howell, attest that this documentation has been prepared under the direction and in the presence of Darlyne Russian, MD Electronically Signed: Ladene Artist, ED Scribe 12/13/2015 at 8:36 AM.  Chief Complaint:  Chief Complaint  Patient presents with  . Medication Refill    amlodipine, klon-con, and triamterene  . Eye Pain    pt eye is red, but no pain/ x yesterday   HPI: [name] is a 80 y.o. female, with a h/o HTN, who reports to [facility] today complaining of sudden onset of right eye redness first noticed yesterday morning. She states that she initially noticed eye redness when she woke up yesterday. Pt denies injury. She denies eye pain, photophobia, foreign bodies. Pt reports h/o bilateral cataracts surgery by Dr. Talbert Forest.  Medication Refill Pt also presents for a medication refill of amlodipine, klon-con and triamterene. She is currently in between doctors. Pt currently lives in [place] and establish care with her new physician in [place] on 01/16/16. Pt does not currently take a daily baby aspirin.  Leg Swelling Pt reports bilateral leg swelling for several months that she has discussed with her prior PCP, Dr. Criss Rosales. Pt wears support stockings.  Past Medical History  Diagnosis Date  . Arthritis   . Hyperlipidemia   . Hypertension   . Sleep apnea     cpap 3 yrs no problems use 1/2 tx  . GERD (gastroesophageal reflux disease)     occ   Past Surgical History  Procedure Laterality Date  . Total knee arthroplasty  01/23/2010/ 02/21/11    bil  . Abdominal hysterectomy  07/31/10  . Tonsillectomy    . Eye surgery      bil  . Cholecystectomy  04/24/2012    Procedure: LAPAROSCOPIC CHOLECYSTECTOMY;  Surgeon: Harl Bowie, MD;  Location: [place];  Service: General;  Laterality: N/A;   Social History   Social History  . Marital Status: Married   Spouse Name: N/A  . Number of Children: N/A  . Years of Education: N/A   Social History Main Topics  . Smoking status: Never Smoker   . Smokeless tobacco: None  . Alcohol Use: No  . Drug Use: No  . Sexual Activity: Yes    Birth Control/ Protection: Post-menopausal   Other Topics Concern  . None   Social History Narrative   Family History  Problem Relation Age of Onset  . Prostate cancer Father    Allergies  Allergen Reactions  . Oysters [Shellfish Allergy] Nausea And Vomiting    No problem with shrimp   Prior to Admission medications   Medication Sig Start Date End Date Taking? Authorizing Provider  amLODipine (NORVASC) 10 MG tablet Take 10 mg by mouth daily.   Yes Historical Provider, MD  Calcium-Magnesium-Zinc (CALCIUM-MAGNESUIUM-ZINC PO) Take 1 capsule by mouth daily.   Yes Historical Provider, MD  dexamethasone (DECADRON) 4 MG tablet Take 4 mg by mouth 2 (two) times daily with a meal.   Yes Historical Provider, MD  etodolac (LODINE) 400 MG tablet Take 400 mg by mouth 2 (two) times daily.   Yes Historical Provider, MD  KLOR-CON M20 20 MEQ tablet Take 20 mEq by mouth daily.  04/06/12  Yes Historical Provider, MD  Multiple Vitamins-Minerals (CENTRUM SILVER PO) Take 1 tablet by mouth daily.   Yes Historical Provider, MD  Omega-3 Fatty Acids (  FISH OIL PO) Take 1 capsule by mouth daily.   Yes Historical Provider, MD  triamterene-hydrochlorothiazide (MAXZIDE-25) 37.5-25 MG per tablet Take 1 tablet by mouth daily.   Yes Historical Provider, MD  Coenzyme Q10 (CO Q 10 PO) Take 1 capsule by mouth daily. Reported on 12/13/2015    Historical Provider, MD   ROS: The patient denies fevers, chills, night sweats, unintentional weight loss, -eye pain, -photophobia, chest pain, palpitations, wheezing, dyspnea on exertion, nausea, vomiting, abdominal pain, dysuria, hematuria, melena, numbness, weakness, or tingling. +eye redness, +leg swelling  All other systems have been reviewed and were  otherwise negative with the exception of those mentioned in the HPI and as above.    PHYSICAL EXAM: Filed Vitals:   12/13/15 0819  BP: 162/70  Pulse: 68  Temp: 97.8 F (36.6 C)  Resp: 16   Body mass index is 40.24 kg/(m^2).  General: Alert, no acute distress HEENT:  Normocephalic, atraumatic, oropharynx patent. Eye: EOMI, PEERLDC redness around the medical and inferior portions of R eye. Cornea itself is clear. She has had cataracts surgery. Disc margin is sharp. No tenderness over the globe. Cardiovascular:  Regular rate and rhythm, no rubs murmurs or gallops.  No Carotid bruits, radial pulse intact. No pedal edema.  Respiratory: Clear to auscultation bilaterally.  No wheezes, rales, or rhonchi.  No cyanosis, no use of accessory musculature Abdominal: No organomegaly, abdomen is soft and non-tender, positive bowel sounds.  No masses. Musculoskeletal: Gait intact. No tenderness. 4+ swelling in both legs. Has support stockings on.  Skin: No rashes. Neurologic: Facial musculature symmetric. Psychiatric: Patient acts appropriately throughout our interaction. Lymphatic: No cervical or submandibular lymphadenopathy  LABS: Results for orders placed or performed in visit on 12/13/15  POCT CBC  Result Value Ref Range   WBC 9.6 4.6 - 10.2 K/uL   Lymph, poc 1.9 0.6 - 3.4   POC LYMPH PERCENT 20.0 10 - 50 %L   MID (cbc) 0.7 0 - 0.9   POC MID % 6.8 0 - 12 %M   POC Granulocyte 7.0 (A) 2 - 6.9   Granulocyte percent 73.2 37 - 80 %G   RBC 4.32 4.04 - 5.48 M/uL   Hemoglobin 13.9 12.2 - 16.2 g/dL   HCT, POC 39.3 37.7 - 47.9 %   MCV 90.7 80 - 97 fL   MCH, POC 32.0 (A) 27 - 31.2 pg   MCHC 35.3 31.8 - 35.4 g/dL   RDW, POC 13.3 %   Platelet Count, POC 205 142 - 424 K/uL   MPV 7.8 0 - 99.8 fL  POCT urinalysis dipstick  Result Value Ref Range   Color, UA yellow yellow   Clarity, UA clear clear   Glucose, UA negative negative   Bilirubin, UA negative negative   Ketones, POC UA negative  negative   Spec Grav, UA 1.010    Blood, UA negative negative   pH, UA 5.0    Protein Ur, POC negative negative   Urobilinogen, UA 0.2    Nitrite, UA Negative Negative   Leukocytes, UA Negative Negative   EKG/XRAY:   Primary read interpreted by [facility] Everlene Farrier at [facility].  ASSESSMENT/PLAN: Patient has some mild redness around the conjunctiva of the right eye. Will treat this with Ethicon drops. I did refill her blood pressure medications. She has an appointment with a new doctor in [place] next month so I did not make any changes. She does have significant lower extremity edema which I suspect is secondary to Norvasc.  She can discuss blood pressure medication changes which are new PCP. I did order Dopplers of her lower extremities as well as a basic metabolic panel I personally performed the services described in this documentation, which was scribed in my presence. The recorded information has been reviewed and is accurate.   Gross sideeffects, risk and benefits, and alternatives of medications d/w patient. Patient is aware that all medications have potential sideeffects and we are unable to predict every sideeffect or drug-drug interaction that may occur.  Arlyss Queen MD 12/13/2015 8:23 AM

## 2015-12-14 LAB — BASIC METABOLIC PANEL WITH GFR
BUN: 27 mg/dL — ABNORMAL HIGH (ref 7–25)
CHLORIDE: 104 mmol/L (ref 98–110)
CO2: 17 mmol/L — ABNORMAL LOW (ref 20–31)
CREATININE: 1.38 mg/dL — AB (ref 0.60–0.93)
Calcium: 10.3 mg/dL (ref 8.6–10.4)
GFR, Est African American: 43 mL/min — ABNORMAL LOW (ref >=60)
GFR, Est Non African American: 37 mL/min — ABNORMAL LOW (ref >=60)
Glucose, Bld: 120 mg/dL — ABNORMAL HIGH (ref 65–99)
Potassium: 3.6 mmol/L (ref 3.5–5.3)
SODIUM: 144 mmol/L (ref 135–146)

## 2015-12-15 ENCOUNTER — Other Ambulatory Visit: Payer: Self-pay | Admitting: Emergency Medicine

## 2015-12-15 ENCOUNTER — Ambulatory Visit: Payer: Medicare Other

## 2015-12-15 DIAGNOSIS — R601 Generalized edema: Secondary | ICD-10-CM | POA: Diagnosis not present

## 2015-12-15 DIAGNOSIS — R2243 Localized swelling, mass and lump, lower limb, bilateral: Secondary | ICD-10-CM | POA: Diagnosis not present

## 2015-12-18 ENCOUNTER — Other Ambulatory Visit (INDEPENDENT_AMBULATORY_CARE_PROVIDER_SITE_OTHER): Payer: Medicare Other | Admitting: Emergency Medicine

## 2015-12-18 ENCOUNTER — Telehealth: Payer: Self-pay

## 2015-12-18 ENCOUNTER — Other Ambulatory Visit: Payer: Self-pay | Admitting: Emergency Medicine

## 2015-12-18 DIAGNOSIS — R739 Hyperglycemia, unspecified: Secondary | ICD-10-CM

## 2015-12-18 DIAGNOSIS — I10 Essential (primary) hypertension: Secondary | ICD-10-CM | POA: Diagnosis not present

## 2015-12-18 LAB — BASIC METABOLIC PANEL WITH GFR
BUN: 27 mg/dL — AB (ref 7–25)
CALCIUM: 9.7 mg/dL (ref 8.6–10.4)
CO2: 26 mmol/L (ref 20–31)
CREATININE: 1.21 mg/dL — AB (ref 0.60–0.93)
Chloride: 103 mmol/L (ref 98–110)
GFR, Est African American: 50 mL/min — ABNORMAL LOW (ref >=60)
GFR, Est Non African American: 43 mL/min — ABNORMAL LOW (ref >=60)
Glucose, Bld: 92 mg/dL (ref 65–99)
Potassium: 4.2 mmol/L (ref 3.5–5.3)
SODIUM: 138 mmol/L (ref 135–146)

## 2015-12-18 LAB — POCT GLYCOSYLATED HEMOGLOBIN (HGB A1C): HEMOGLOBIN A1C: 5.7

## 2015-12-18 LAB — GLUCOSE, POCT (MANUAL RESULT ENTRY): POC Glucose: 97 mg/dl (ref 70–99)

## 2015-12-18 NOTE — Telephone Encounter (Signed)
Pt son called stating that lab called the wrong number and we need to call [phone]

## 2015-12-18 NOTE — Telephone Encounter (Signed)
Pt son called to state that lab called the wrong number that we need to call [phone]

## 2015-12-25 ENCOUNTER — Telehealth: Payer: Self-pay

## 2015-12-25 NOTE — Telephone Encounter (Signed)
Pt notified of labs

## 2015-12-25 NOTE — Telephone Encounter (Signed)
Spoke to pt and informed of lab results.  

## 2015-12-25 NOTE — Progress Notes (Signed)
Spoke to pt and informed of results

## 2016-01-16 DIAGNOSIS — Z6839 Body mass index (BMI) 39.0-39.9, adult: Secondary | ICD-10-CM | POA: Diagnosis not present

## 2016-01-16 DIAGNOSIS — I10 Essential (primary) hypertension: Secondary | ICD-10-CM | POA: Diagnosis not present

## 2016-01-16 DIAGNOSIS — N183 Chronic kidney disease, stage 3 (moderate): Secondary | ICD-10-CM | POA: Diagnosis not present

## 2016-01-16 DIAGNOSIS — N3946 Mixed incontinence: Secondary | ICD-10-CM | POA: Diagnosis not present

## 2016-01-16 DIAGNOSIS — R7303 Prediabetes: Secondary | ICD-10-CM | POA: Diagnosis not present

## 2016-01-23 NOTE — Telephone Encounter (Signed)
This encounter was created in error - please disregard.

## 2016-02-15 DIAGNOSIS — G8929 Other chronic pain: Secondary | ICD-10-CM | POA: Diagnosis not present

## 2016-02-15 DIAGNOSIS — N3946 Mixed incontinence: Secondary | ICD-10-CM | POA: Diagnosis not present

## 2016-02-15 DIAGNOSIS — I10 Essential (primary) hypertension: Secondary | ICD-10-CM | POA: Diagnosis not present

## 2016-02-15 DIAGNOSIS — Z6841 Body Mass Index (BMI) 40.0 and over, adult: Secondary | ICD-10-CM | POA: Diagnosis not present

## 2016-02-15 DIAGNOSIS — M5136 Other intervertebral disc degeneration, lumbar region: Secondary | ICD-10-CM | POA: Diagnosis not present

## 2016-02-15 DIAGNOSIS — M545 Low back pain: Secondary | ICD-10-CM | POA: Diagnosis not present

## 2016-02-15 DIAGNOSIS — N183 Chronic kidney disease, stage 3 (moderate): Secondary | ICD-10-CM | POA: Diagnosis not present

## 2016-03-26 ENCOUNTER — Other Ambulatory Visit (HOSPITAL_COMMUNITY): Payer: Self-pay | Admitting: Internal Medicine

## 2016-03-26 DIAGNOSIS — Z1231 Encounter for screening mammogram for malignant neoplasm of breast: Secondary | ICD-10-CM

## 2016-03-29 ENCOUNTER — Ambulatory Visit (INDEPENDENT_AMBULATORY_CARE_PROVIDER_SITE_OTHER): Payer: Medicare Other

## 2016-03-29 ENCOUNTER — Ambulatory Visit (INDEPENDENT_AMBULATORY_CARE_PROVIDER_SITE_OTHER): Payer: Medicare Other | Admitting: Family Medicine

## 2016-03-29 VITALS — BP 130/80 | HR 80 | Temp 98.2°F | Resp 16 | Ht 67.5 in | Wt 250.0 lb

## 2016-03-29 DIAGNOSIS — R51 Headache: Secondary | ICD-10-CM | POA: Diagnosis not present

## 2016-03-29 DIAGNOSIS — S0083XA Contusion of other part of head, initial encounter: Secondary | ICD-10-CM

## 2016-03-29 DIAGNOSIS — S01511A Laceration without foreign body of lip, initial encounter: Secondary | ICD-10-CM

## 2016-03-29 DIAGNOSIS — S0031XA Abrasion of nose, initial encounter: Secondary | ICD-10-CM | POA: Diagnosis not present

## 2016-03-29 DIAGNOSIS — S40211A Abrasion of right shoulder, initial encounter: Secondary | ICD-10-CM | POA: Diagnosis not present

## 2016-03-29 DIAGNOSIS — R519 Headache, unspecified: Secondary | ICD-10-CM

## 2016-03-29 DIAGNOSIS — W19XXXA Unspecified fall, initial encounter: Secondary | ICD-10-CM

## 2016-03-29 MED ORDER — AMOXICILLIN 500 MG PO TABS: 1000.0000 mg | ORAL_TABLET | Freq: Two times a day (BID) | ORAL | Status: DC
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
## 2016-03-29 NOTE — Patient Instructions (Signed)
     IF you received an x-ray today, you will receive an invoice from [facility]. Please contact [facility] at [phone] with questions or concerns regarding your invoice.   IF you received labwork today, you will receive an invoice from [facility]. Please contact [facility] at [phone] with questions or concerns regarding your invoice.   Our billing staff will not be able to assist you with questions regarding bills from these companies.  You will be contacted with the lab results as soon as they are available. The fastest way to get your results is to activate your My Chart account. Instructions are located on the last page of this paperwork. If you have not heard from us regarding the results in 2 weeks, please contact this office.      

## 2016-04-25 ENCOUNTER — Ambulatory Visit (HOSPITAL_COMMUNITY): Payer: Medicare Other

## 2016-04-29 ENCOUNTER — Ambulatory Visit (HOSPITAL_COMMUNITY)
Admission: RE | Admit: 2016-04-29 | Discharge: 2016-04-29 | Disposition: A | Discharge status: Home / Self Care | Payer: Medicare Other | Source: Ambulatory Visit | Attending: Internal Medicine | Admitting: Internal Medicine

## 2016-04-29 DIAGNOSIS — Z1231 Encounter for screening mammogram for malignant neoplasm of breast: Secondary | ICD-10-CM | POA: Diagnosis not present

## 2016-05-16 DIAGNOSIS — R9431 Abnormal electrocardiogram [ECG] [EKG]: Secondary | ICD-10-CM | POA: Diagnosis not present

## 2016-05-16 DIAGNOSIS — I10 Essential (primary) hypertension: Secondary | ICD-10-CM | POA: Diagnosis not present

## 2016-05-16 DIAGNOSIS — R42 Dizziness and giddiness: Secondary | ICD-10-CM | POA: Diagnosis not present

## 2016-05-16 DIAGNOSIS — N3946 Mixed incontinence: Secondary | ICD-10-CM | POA: Diagnosis not present

## 2016-05-16 DIAGNOSIS — M545 Low back pain: Secondary | ICD-10-CM | POA: Diagnosis not present

## 2016-05-16 DIAGNOSIS — G8929 Other chronic pain: Secondary | ICD-10-CM | POA: Diagnosis not present

## 2016-05-31 DIAGNOSIS — I6523 Occlusion and stenosis of bilateral carotid arteries: Secondary | ICD-10-CM | POA: Diagnosis not present

## 2016-05-31 DIAGNOSIS — R42 Dizziness and giddiness: Secondary | ICD-10-CM | POA: Diagnosis not present

## 2016-06-17 DIAGNOSIS — R42 Dizziness and giddiness: Secondary | ICD-10-CM | POA: Diagnosis not present

## 2016-06-17 DIAGNOSIS — R002 Palpitations: Secondary | ICD-10-CM | POA: Diagnosis not present

## 2016-06-17 DIAGNOSIS — G4733 Obstructive sleep apnea (adult) (pediatric): Secondary | ICD-10-CM | POA: Diagnosis not present

## 2016-06-17 DIAGNOSIS — I10 Essential (primary) hypertension: Secondary | ICD-10-CM | POA: Diagnosis not present

## 2016-06-17 DIAGNOSIS — R9431 Abnormal electrocardiogram [ECG] [EKG]: Secondary | ICD-10-CM | POA: Diagnosis not present

## 2016-06-20 DIAGNOSIS — R9431 Abnormal electrocardiogram [ECG] [EKG]: Secondary | ICD-10-CM | POA: Diagnosis not present

## 2016-06-20 DIAGNOSIS — R42 Dizziness and giddiness: Secondary | ICD-10-CM | POA: Diagnosis not present

## 2016-06-20 DIAGNOSIS — R002 Palpitations: Secondary | ICD-10-CM | POA: Diagnosis not present

## 2016-06-23 DIAGNOSIS — R002 Palpitations: Secondary | ICD-10-CM | POA: Diagnosis not present

## 2016-07-03 DIAGNOSIS — R42 Dizziness and giddiness: Secondary | ICD-10-CM | POA: Diagnosis not present

## 2016-07-03 DIAGNOSIS — N183 Chronic kidney disease, stage 3 (moderate): Secondary | ICD-10-CM | POA: Diagnosis not present

## 2016-07-03 DIAGNOSIS — R0602 Shortness of breath: Secondary | ICD-10-CM | POA: Diagnosis not present

## 2016-07-03 DIAGNOSIS — I10 Essential (primary) hypertension: Secondary | ICD-10-CM | POA: Diagnosis not present

## 2016-07-03 DIAGNOSIS — R9431 Abnormal electrocardiogram [ECG] [EKG]: Secondary | ICD-10-CM | POA: Diagnosis not present

## 2016-07-17 DIAGNOSIS — I10 Essential (primary) hypertension: Secondary | ICD-10-CM | POA: Diagnosis not present

## 2016-07-17 DIAGNOSIS — N183 Chronic kidney disease, stage 3 (moderate): Secondary | ICD-10-CM | POA: Diagnosis not present

## 2016-07-17 DIAGNOSIS — R7303 Prediabetes: Secondary | ICD-10-CM | POA: Diagnosis not present

## 2016-07-17 DIAGNOSIS — G4733 Obstructive sleep apnea (adult) (pediatric): Secondary | ICD-10-CM | POA: Diagnosis not present

## 2016-07-23 DIAGNOSIS — R0602 Shortness of breath: Secondary | ICD-10-CM | POA: Diagnosis not present

## 2016-07-26 ENCOUNTER — Ambulatory Visit: Payer: Medicare Other | Attending: Specialist

## 2016-07-26 DIAGNOSIS — G4761 Periodic limb movement disorder: Secondary | ICD-10-CM | POA: Insufficient documentation

## 2016-07-26 DIAGNOSIS — G4733 Obstructive sleep apnea (adult) (pediatric): Secondary | ICD-10-CM | POA: Diagnosis not present

## 2016-09-10 DIAGNOSIS — Z23 Encounter for immunization: Secondary | ICD-10-CM | POA: Diagnosis not present

## 2016-10-15 DIAGNOSIS — R7303 Prediabetes: Secondary | ICD-10-CM | POA: Diagnosis not present

## 2016-10-15 DIAGNOSIS — I10 Essential (primary) hypertension: Secondary | ICD-10-CM | POA: Diagnosis not present

## 2016-10-22 DIAGNOSIS — I10 Essential (primary) hypertension: Secondary | ICD-10-CM | POA: Diagnosis not present

## 2016-10-22 DIAGNOSIS — N3946 Mixed incontinence: Secondary | ICD-10-CM | POA: Diagnosis not present

## 2016-10-22 DIAGNOSIS — G4733 Obstructive sleep apnea (adult) (pediatric): Secondary | ICD-10-CM | POA: Diagnosis not present

## 2016-10-22 DIAGNOSIS — N183 Chronic kidney disease, stage 3 (moderate): Secondary | ICD-10-CM | POA: Diagnosis not present

## 2016-10-30 DIAGNOSIS — N183 Chronic kidney disease, stage 3 (moderate): Secondary | ICD-10-CM | POA: Diagnosis not present

## 2016-10-30 DIAGNOSIS — G4733 Obstructive sleep apnea (adult) (pediatric): Secondary | ICD-10-CM | POA: Diagnosis not present

## 2016-10-30 DIAGNOSIS — R0602 Shortness of breath: Secondary | ICD-10-CM | POA: Diagnosis not present

## 2016-10-30 DIAGNOSIS — I10 Essential (primary) hypertension: Secondary | ICD-10-CM | POA: Diagnosis not present

## 2016-10-30 DIAGNOSIS — R9431 Abnormal electrocardiogram [ECG] [EKG]: Secondary | ICD-10-CM | POA: Diagnosis not present

## 2017-01-23 DIAGNOSIS — R7303 Prediabetes: Secondary | ICD-10-CM | POA: Diagnosis not present

## 2017-01-23 DIAGNOSIS — I10 Essential (primary) hypertension: Secondary | ICD-10-CM | POA: Diagnosis not present

## 2017-01-23 DIAGNOSIS — G4733 Obstructive sleep apnea (adult) (pediatric): Secondary | ICD-10-CM | POA: Diagnosis not present

## 2017-01-23 DIAGNOSIS — N3946 Mixed incontinence: Secondary | ICD-10-CM | POA: Diagnosis not present

## 2017-01-23 DIAGNOSIS — N183 Chronic kidney disease, stage 3 (moderate): Secondary | ICD-10-CM | POA: Diagnosis not present

## 2017-04-03 ENCOUNTER — Other Ambulatory Visit (HOSPITAL_COMMUNITY): Payer: Self-pay | Admitting: Internal Medicine

## 2017-04-03 DIAGNOSIS — Z1231 Encounter for screening mammogram for malignant neoplasm of breast: Secondary | ICD-10-CM

## 2017-04-28 DIAGNOSIS — R7303 Prediabetes: Secondary | ICD-10-CM | POA: Diagnosis not present

## 2017-04-28 DIAGNOSIS — N183 Chronic kidney disease, stage 3 (moderate): Secondary | ICD-10-CM | POA: Diagnosis not present

## 2017-04-28 DIAGNOSIS — I10 Essential (primary) hypertension: Secondary | ICD-10-CM | POA: Diagnosis not present

## 2017-05-05 ENCOUNTER — Ambulatory Visit (HOSPITAL_COMMUNITY): Payer: Medicare Other

## 2017-05-05 DIAGNOSIS — M545 Low back pain: Secondary | ICD-10-CM | POA: Diagnosis not present

## 2017-05-05 DIAGNOSIS — G4733 Obstructive sleep apnea (adult) (pediatric): Secondary | ICD-10-CM | POA: Diagnosis not present

## 2017-05-05 DIAGNOSIS — R7303 Prediabetes: Secondary | ICD-10-CM | POA: Diagnosis not present

## 2017-05-05 DIAGNOSIS — I10 Essential (primary) hypertension: Secondary | ICD-10-CM | POA: Diagnosis not present

## 2017-05-05 DIAGNOSIS — G8929 Other chronic pain: Secondary | ICD-10-CM | POA: Diagnosis not present

## 2017-05-05 DIAGNOSIS — N183 Chronic kidney disease, stage 3 (moderate): Secondary | ICD-10-CM | POA: Diagnosis not present

## 2017-05-14 DIAGNOSIS — R9431 Abnormal electrocardiogram [ECG] [EKG]: Secondary | ICD-10-CM | POA: Diagnosis not present

## 2017-05-14 DIAGNOSIS — G4733 Obstructive sleep apnea (adult) (pediatric): Secondary | ICD-10-CM | POA: Diagnosis not present

## 2017-05-14 DIAGNOSIS — I10 Essential (primary) hypertension: Secondary | ICD-10-CM | POA: Diagnosis not present

## 2017-05-14 DIAGNOSIS — N183 Chronic kidney disease, stage 3 (moderate): Secondary | ICD-10-CM | POA: Diagnosis not present

## 2017-05-14 DIAGNOSIS — R0602 Shortness of breath: Secondary | ICD-10-CM | POA: Diagnosis not present

## 2017-05-19 ENCOUNTER — Ambulatory Visit (HOSPITAL_COMMUNITY): Payer: Medicare Other

## 2017-05-28 ENCOUNTER — Ambulatory Visit (HOSPITAL_COMMUNITY)
Admission: RE | Admit: 2017-05-28 | Discharge: 2017-05-28 | Disposition: A | Discharge status: Home / Self Care | Payer: Medicare Other | Source: Ambulatory Visit | Attending: Internal Medicine | Admitting: Internal Medicine

## 2017-05-28 DIAGNOSIS — Z1231 Encounter for screening mammogram for malignant neoplasm of breast: Secondary | ICD-10-CM

## 2017-06-05 ENCOUNTER — Other Ambulatory Visit (HOSPITAL_COMMUNITY): Payer: Self-pay | Admitting: Internal Medicine

## 2017-06-05 DIAGNOSIS — R928 Other abnormal and inconclusive findings on diagnostic imaging of breast: Secondary | ICD-10-CM

## 2017-06-17 ENCOUNTER — Other Ambulatory Visit (HOSPITAL_COMMUNITY): Payer: Self-pay | Admitting: Internal Medicine

## 2017-06-17 ENCOUNTER — Ambulatory Visit (HOSPITAL_COMMUNITY)
Admission: RE | Admit: 2017-06-17 | Discharge: 2017-06-17 | Disposition: A | Discharge status: Home / Self Care | Payer: Medicare Other | Source: Ambulatory Visit | Attending: Internal Medicine | Admitting: Internal Medicine

## 2017-06-17 DIAGNOSIS — R928 Other abnormal and inconclusive findings on diagnostic imaging of breast: Secondary | ICD-10-CM | POA: Diagnosis not present

## 2017-06-17 DIAGNOSIS — Z1231 Encounter for screening mammogram for malignant neoplasm of breast: Secondary | ICD-10-CM

## 2017-06-17 DIAGNOSIS — N6001 Solitary cyst of right breast: Secondary | ICD-10-CM | POA: Insufficient documentation

## 2017-06-17 DIAGNOSIS — N6489 Other specified disorders of breast: Secondary | ICD-10-CM | POA: Diagnosis not present

## 2017-08-28 DIAGNOSIS — N183 Chronic kidney disease, stage 3 (moderate): Secondary | ICD-10-CM | POA: Diagnosis not present

## 2017-09-04 DIAGNOSIS — I10 Essential (primary) hypertension: Secondary | ICD-10-CM | POA: Diagnosis not present

## 2017-09-04 DIAGNOSIS — R7303 Prediabetes: Secondary | ICD-10-CM | POA: Diagnosis not present

## 2017-09-04 DIAGNOSIS — G8929 Other chronic pain: Secondary | ICD-10-CM | POA: Diagnosis not present

## 2017-09-04 DIAGNOSIS — N3946 Mixed incontinence: Secondary | ICD-10-CM | POA: Diagnosis not present

## 2017-09-04 DIAGNOSIS — Z23 Encounter for immunization: Secondary | ICD-10-CM | POA: Diagnosis not present

## 2017-09-04 DIAGNOSIS — N183 Chronic kidney disease, stage 3 (moderate): Secondary | ICD-10-CM | POA: Diagnosis not present

## 2017-09-04 DIAGNOSIS — M545 Low back pain: Secondary | ICD-10-CM | POA: Diagnosis not present

## 2017-10-30 DIAGNOSIS — R0602 Shortness of breath: Secondary | ICD-10-CM | POA: Diagnosis not present

## 2017-10-30 DIAGNOSIS — N183 Chronic kidney disease, stage 3 (moderate): Secondary | ICD-10-CM | POA: Diagnosis not present

## 2017-10-30 DIAGNOSIS — I10 Essential (primary) hypertension: Secondary | ICD-10-CM | POA: Diagnosis not present

## 2017-10-30 DIAGNOSIS — G4733 Obstructive sleep apnea (adult) (pediatric): Secondary | ICD-10-CM | POA: Diagnosis not present

## 2018-05-29 ENCOUNTER — Ambulatory Visit (HOSPITAL_COMMUNITY)
Admission: RE | Admit: 2018-05-29 | Discharge: 2018-05-29 | Disposition: A | Discharge status: Home / Self Care | Payer: Medicare Other | Source: Ambulatory Visit | Attending: Internal Medicine | Admitting: Internal Medicine

## 2018-05-29 ENCOUNTER — Encounter (HOSPITAL_COMMUNITY): Payer: Self-pay

## 2018-05-29 DIAGNOSIS — Z1231 Encounter for screening mammogram for malignant neoplasm of breast: Secondary | ICD-10-CM | POA: Insufficient documentation

## 2019-01-28 ENCOUNTER — Ambulatory Visit (INDEPENDENT_AMBULATORY_CARE_PROVIDER_SITE_OTHER): Payer: Medicare Other | Admitting: Otolaryngology

## 2019-01-28 DIAGNOSIS — H6982 Other specified disorders of Eustachian tube, left ear: Secondary | ICD-10-CM

## 2019-01-28 DIAGNOSIS — H903 Sensorineural hearing loss, bilateral: Secondary | ICD-10-CM | POA: Diagnosis not present

## 2019-01-28 DIAGNOSIS — H9209 Otalgia, unspecified ear: Secondary | ICD-10-CM | POA: Diagnosis not present

## 2019-05-11 ENCOUNTER — Other Ambulatory Visit (HOSPITAL_COMMUNITY): Payer: Self-pay | Admitting: Internal Medicine

## 2019-05-11 DIAGNOSIS — Z1231 Encounter for screening mammogram for malignant neoplasm of breast: Secondary | ICD-10-CM

## 2019-06-11 ENCOUNTER — Other Ambulatory Visit: Payer: Self-pay

## 2019-06-11 ENCOUNTER — Ambulatory Visit (HOSPITAL_COMMUNITY)
Admission: RE | Admit: 2019-06-11 | Discharge: 2019-06-11 | Disposition: A | Discharge status: Home / Self Care | Payer: Medicare Other | Source: Ambulatory Visit | Attending: Internal Medicine | Admitting: Internal Medicine

## 2019-06-11 DIAGNOSIS — Z1231 Encounter for screening mammogram for malignant neoplasm of breast: Secondary | ICD-10-CM | POA: Diagnosis present

## 2019-06-14 ENCOUNTER — Other Ambulatory Visit (HOSPITAL_COMMUNITY): Payer: Self-pay | Admitting: Internal Medicine

## 2019-06-17 ENCOUNTER — Other Ambulatory Visit (HOSPITAL_COMMUNITY): Payer: Self-pay | Admitting: Internal Medicine

## 2019-06-21 ENCOUNTER — Other Ambulatory Visit (HOSPITAL_COMMUNITY): Payer: Self-pay | Admitting: Internal Medicine

## 2019-06-21 DIAGNOSIS — R928 Other abnormal and inconclusive findings on diagnostic imaging of breast: Secondary | ICD-10-CM

## 2019-06-29 ENCOUNTER — Ambulatory Visit (HOSPITAL_COMMUNITY)
Admission: RE | Admit: 2019-06-29 | Discharge: 2019-06-29 | Disposition: A | Discharge status: Home / Self Care | Payer: Medicare Other | Source: Ambulatory Visit | Attending: Internal Medicine | Admitting: Internal Medicine

## 2019-06-29 ENCOUNTER — Other Ambulatory Visit (HOSPITAL_COMMUNITY): Payer: Self-pay | Admitting: Diagnostic Radiology

## 2019-06-29 ENCOUNTER — Other Ambulatory Visit: Payer: Self-pay

## 2019-06-29 DIAGNOSIS — R928 Other abnormal and inconclusive findings on diagnostic imaging of breast: Secondary | ICD-10-CM | POA: Insufficient documentation

## 2019-07-14 ENCOUNTER — Other Ambulatory Visit: Payer: Self-pay | Admitting: Physician Assistant

## 2019-07-14 ENCOUNTER — Other Ambulatory Visit: Payer: Self-pay

## 2019-07-14 ENCOUNTER — Ambulatory Visit
Admission: RE | Admit: 2019-07-14 | Discharge: 2019-07-14 | Disposition: A | Discharge status: Home / Self Care | Payer: Medicare Other | Source: Ambulatory Visit | Attending: Physician Assistant | Admitting: Physician Assistant

## 2019-07-14 DIAGNOSIS — R1031 Right lower quadrant pain: Secondary | ICD-10-CM

## 2019-07-14 DIAGNOSIS — N183 Chronic kidney disease, stage 3 unspecified: Secondary | ICD-10-CM

## 2019-07-15 ENCOUNTER — Other Ambulatory Visit: Payer: Self-pay | Admitting: Physician Assistant

## 2019-07-15 ENCOUNTER — Other Ambulatory Visit (HOSPITAL_COMMUNITY): Payer: Self-pay | Admitting: Physician Assistant

## 2019-07-15 DIAGNOSIS — N281 Cyst of kidney, acquired: Secondary | ICD-10-CM

## 2019-07-22 ENCOUNTER — Other Ambulatory Visit: Payer: Self-pay

## 2019-07-22 ENCOUNTER — Ambulatory Visit (HOSPITAL_COMMUNITY)
Admission: RE | Admit: 2019-07-22 | Discharge: 2019-07-22 | Disposition: A | Discharge status: Home / Self Care | Payer: Medicare Other | Source: Ambulatory Visit | Attending: Physician Assistant | Admitting: Physician Assistant

## 2019-07-22 DIAGNOSIS — N281 Cyst of kidney, acquired: Secondary | ICD-10-CM | POA: Insufficient documentation

## 2019-07-22 LAB — POCT I-STAT, CHEM 8
BUN: 18 mg/dL (ref 8–23)
Calcium, Ion: 1.29 mmol/L (ref 1.15–1.40)
Chloride: 108 mmol/L (ref 98–111)
Creatinine, Ser: 1.3 mg/dL — ABNORMAL HIGH (ref 0.44–1.00)
Glucose, Bld: 93 mg/dL (ref 70–99)
HCT: 36 % (ref 36.0–46.0)
Hemoglobin: 12.2 g/dL (ref 12.0–15.0)
Potassium: 4.3 mmol/L (ref 3.5–5.1)
Sodium: 144 mmol/L (ref 135–145)
TCO2: 23 mmol/L (ref 22–32)

## 2019-07-22 MED ORDER — GADOBUTROL 1 MMOL/ML IV SOLN
10.0000 mL | Freq: Once | INTRAVENOUS | Status: AC | PRN
  Administered 2019-07-22: 10 mL via INTRAVENOUS

## 2019-12-26 ENCOUNTER — Ambulatory Visit: Payer: Medicare Other

## 2020-05-18 ENCOUNTER — Ambulatory Visit: Payer: Medicare Other | Attending: Internal Medicine

## 2020-05-18 DIAGNOSIS — G4733 Obstructive sleep apnea (adult) (pediatric): Secondary | ICD-10-CM | POA: Diagnosis not present

## 2020-05-19 ENCOUNTER — Other Ambulatory Visit: Payer: Self-pay

## 2020-05-19 ENCOUNTER — Other Ambulatory Visit (HOSPITAL_COMMUNITY): Payer: Self-pay | Admitting: Internal Medicine

## 2020-05-19 DIAGNOSIS — Z1231 Encounter for screening mammogram for malignant neoplasm of breast: Secondary | ICD-10-CM

## 2020-06-30 ENCOUNTER — Other Ambulatory Visit: Payer: Self-pay

## 2020-06-30 ENCOUNTER — Ambulatory Visit (HOSPITAL_COMMUNITY)
Admission: RE | Admit: 2020-06-30 | Discharge: 2020-06-30 | Disposition: A | Discharge status: Home / Self Care | Payer: Medicare Other | Source: Ambulatory Visit | Attending: Internal Medicine | Admitting: Internal Medicine

## 2020-06-30 DIAGNOSIS — Z1231 Encounter for screening mammogram for malignant neoplasm of breast: Secondary | ICD-10-CM | POA: Diagnosis not present

## 2020-07-12 ENCOUNTER — Ambulatory Visit: Payer: Medicare Other | Attending: Internal Medicine

## 2020-07-12 DIAGNOSIS — G4761 Periodic limb movement disorder: Secondary | ICD-10-CM | POA: Insufficient documentation

## 2020-07-12 DIAGNOSIS — G4733 Obstructive sleep apnea (adult) (pediatric): Secondary | ICD-10-CM | POA: Diagnosis present

## 2020-07-17 ENCOUNTER — Other Ambulatory Visit: Payer: Self-pay

## 2020-11-05 IMAGING — MG DIGITAL SCREENING BILATERAL MAMMOGRAM WITH TOMO AND CAD
8 series · 8 of 24 positions shown · non-contrast
Comparison: Previous exam(s).

CLINICAL DATA: Screening.

EXAM:
DIGITAL SCREENING BILATERAL MAMMOGRAM WITH TOMO AND CAD

[L CC synth-2D]
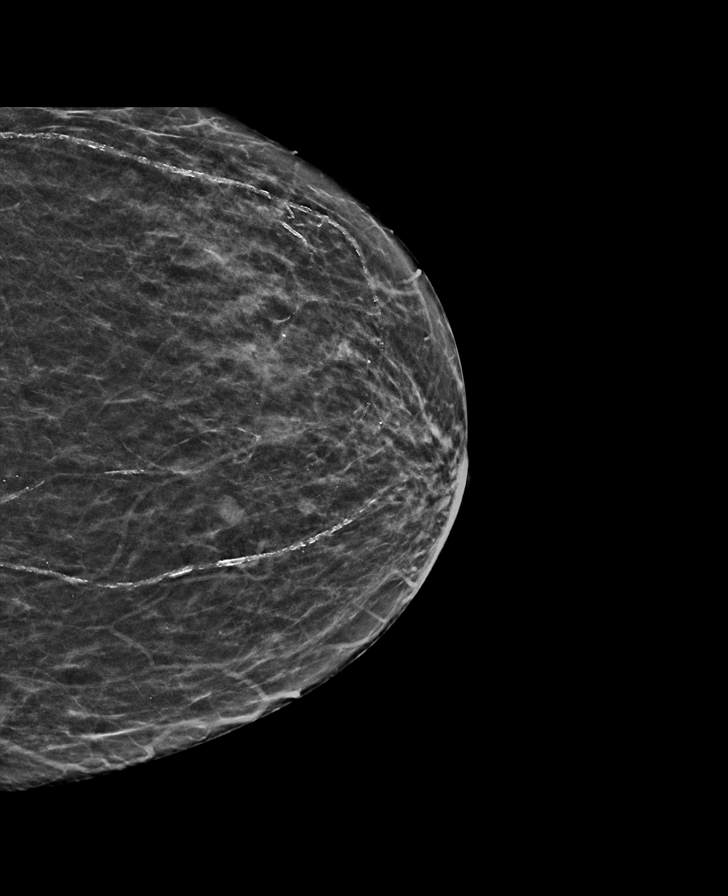

[L MLO synth-2D]
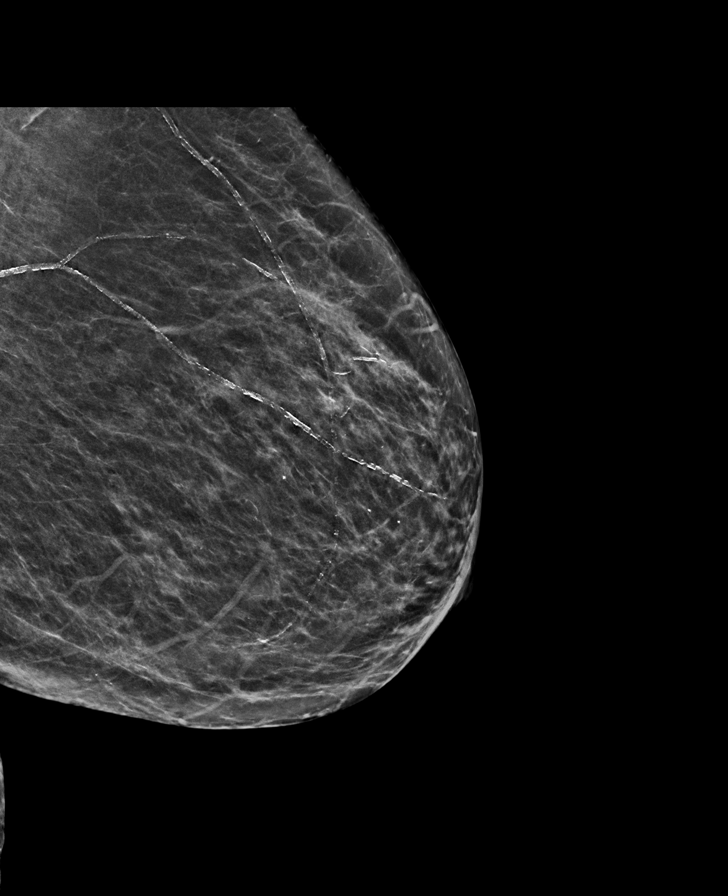

[R CC synth-2D]
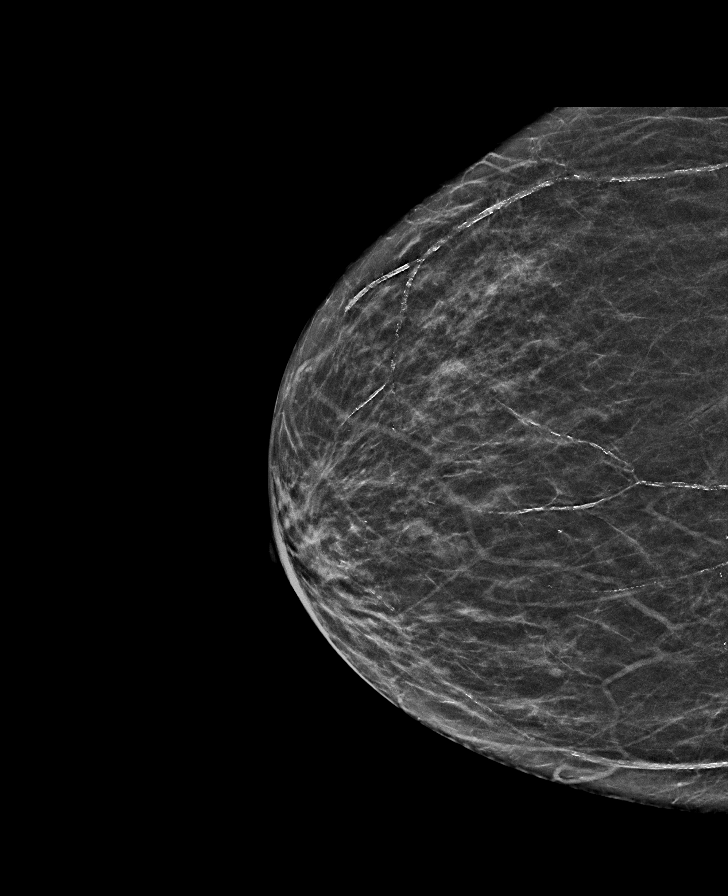

[R MLO synth-2D]
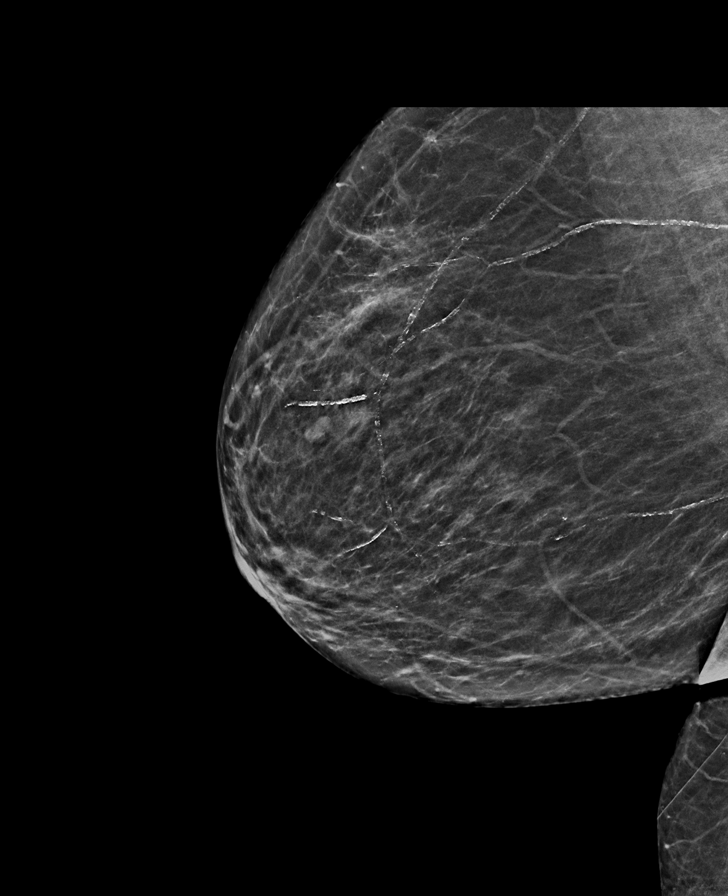

[R CC tomo · tomo slice 27/54.0]
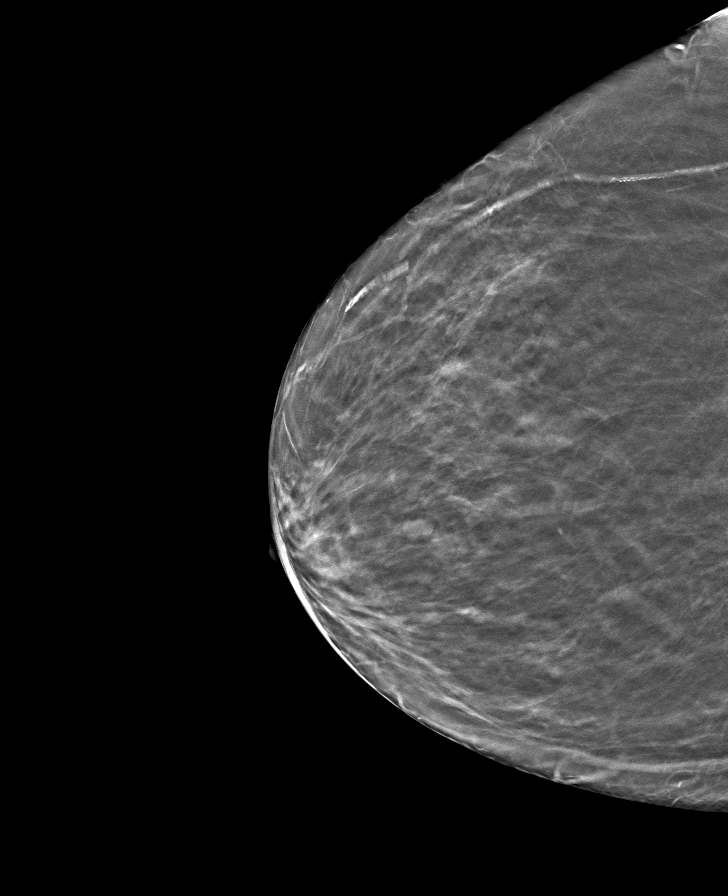

[L MLO tomo · tomo slice 31/62.0]
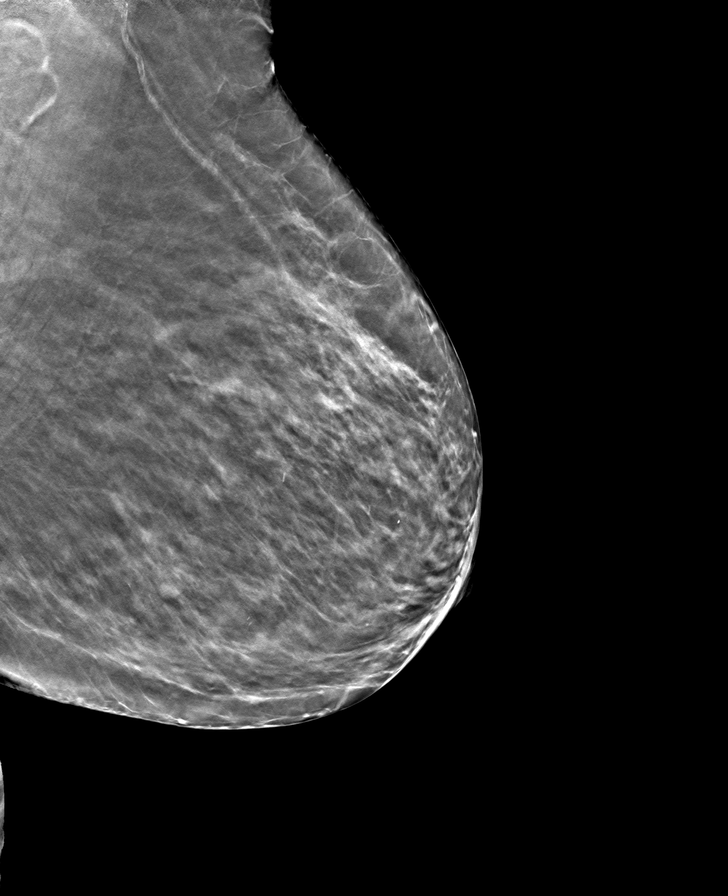

[L CC tomo · tomo slice 28/55.0]
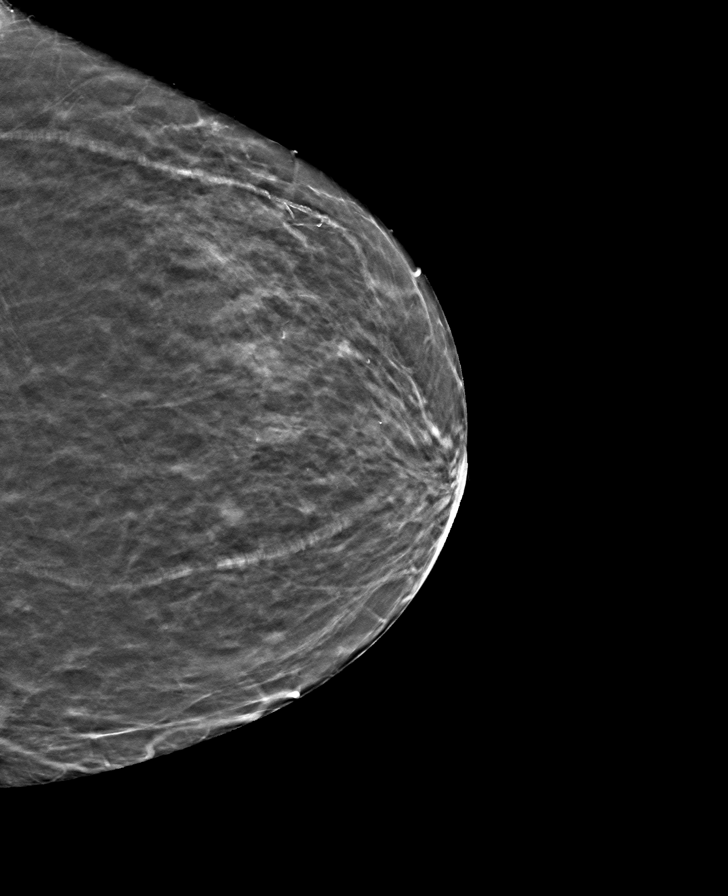

[R MLO tomo · tomo slice 31/61.0]
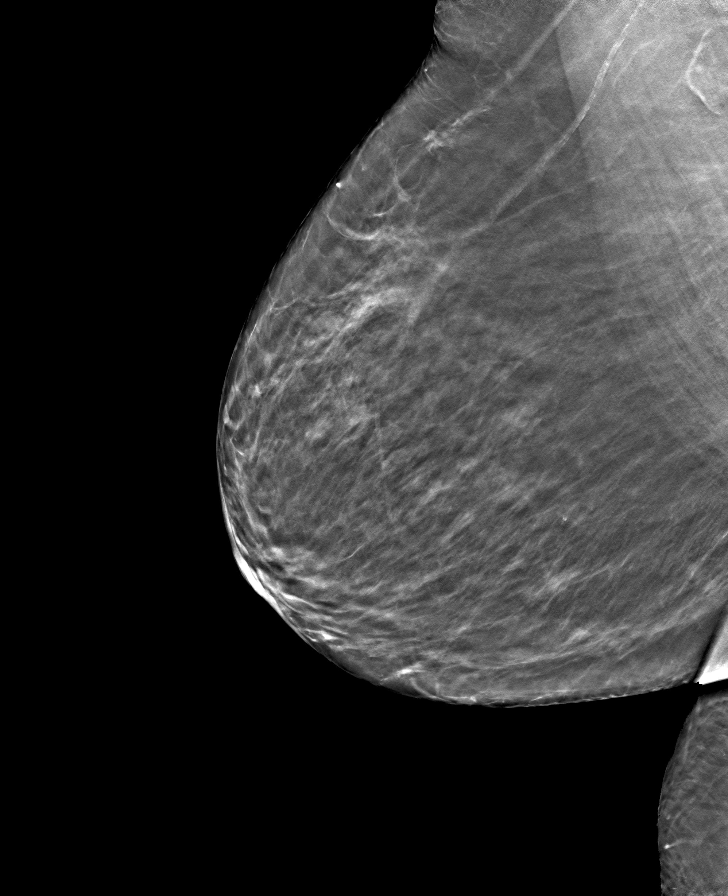

[8 of 24 positions shown; findings below may reference images not displayed]

ACR Breast Density Category b: There are scattered areas of
fibroglandular density.
FINDINGS: In the left breast, a possible mass warrants further evaluation. In
the right breast, no findings suspicious for malignancy.

Images were processed with CAD.
IMPRESSION: Further evaluation is suggested for possible mass in the left
breast.

RECOMMENDATION:
Diagnostic mammogram and possibly ultrasound of the left breast.
(Code:JC-2-SSL)

The patient will be contacted regarding the findings, and additional
imaging will be scheduled.

BI-RADS CATEGORY  0: Incomplete. Need additional imaging evaluation
and/or prior mammograms for comparison.

## 2021-06-08 ENCOUNTER — Other Ambulatory Visit (HOSPITAL_COMMUNITY): Payer: Self-pay | Admitting: Internal Medicine

## 2021-06-08 DIAGNOSIS — Z1231 Encounter for screening mammogram for malignant neoplasm of breast: Secondary | ICD-10-CM

## 2021-07-02 ENCOUNTER — Other Ambulatory Visit: Payer: Self-pay

## 2021-07-02 ENCOUNTER — Ambulatory Visit (HOSPITAL_COMMUNITY)
Admission: RE | Admit: 2021-07-02 | Discharge: 2021-07-02 | Disposition: A | Discharge status: Home / Self Care | Payer: Medicare Other | Source: Ambulatory Visit | Attending: Internal Medicine | Admitting: Internal Medicine

## 2021-07-02 DIAGNOSIS — Z1231 Encounter for screening mammogram for malignant neoplasm of breast: Secondary | ICD-10-CM | POA: Insufficient documentation

## 2022-05-30 ENCOUNTER — Other Ambulatory Visit (HOSPITAL_COMMUNITY): Payer: Self-pay | Admitting: Internal Medicine

## 2022-05-30 DIAGNOSIS — Z1231 Encounter for screening mammogram for malignant neoplasm of breast: Secondary | ICD-10-CM

## 2022-07-04 ENCOUNTER — Ambulatory Visit (HOSPITAL_COMMUNITY)
Admission: RE | Admit: 2022-07-04 | Discharge: 2022-07-04 | Disposition: A | Discharge status: Home / Self Care | Payer: Medicare Other | Source: Ambulatory Visit | Attending: Internal Medicine | Admitting: Internal Medicine

## 2022-07-04 DIAGNOSIS — Z1231 Encounter for screening mammogram for malignant neoplasm of breast: Secondary | ICD-10-CM | POA: Insufficient documentation

## 2022-10-04 ENCOUNTER — Encounter: Payer: Medicare Other | Attending: Internal Medicine | Admitting: Dietician

## 2022-10-04 ENCOUNTER — Encounter: Payer: Self-pay | Admitting: Dietician

## 2022-10-04 VITALS — Ht 67.0 in | Wt 259.8 lb

## 2022-10-04 DIAGNOSIS — I129 Hypertensive chronic kidney disease with stage 1 through stage 4 chronic kidney disease, or unspecified chronic kidney disease: Secondary | ICD-10-CM | POA: Insufficient documentation

## 2022-10-04 DIAGNOSIS — N1831 Chronic kidney disease, stage 3a: Secondary | ICD-10-CM | POA: Insufficient documentation

## 2022-10-04 DIAGNOSIS — Z713 Dietary counseling and surveillance: Secondary | ICD-10-CM | POA: Insufficient documentation

## 2022-10-04 DIAGNOSIS — E039 Hypothyroidism, unspecified: Secondary | ICD-10-CM | POA: Insufficient documentation

## 2022-10-04 DIAGNOSIS — Z6841 Body Mass Index (BMI) 40.0 and over, adult: Secondary | ICD-10-CM | POA: Diagnosis not present

## 2022-10-04 DIAGNOSIS — I10 Essential (primary) hypertension: Secondary | ICD-10-CM

## 2022-10-04 DIAGNOSIS — G4730 Sleep apnea, unspecified: Secondary | ICD-10-CM | POA: Insufficient documentation

## 2022-10-04 NOTE — Patient Instructions (Signed)
For kidneys, keep portions of meats to the size of the palm of a hand or less. Eating large portions of protein foods makes the kidneys work too hard and can make the disease worse. Eat something every 4 hours during the day, include a small meal midday -- could be a cup of homemade soup and fruit or crackers, cottage cheese and fruit, crackers and peanut butter with fruit, or even a protein drink if not very hungry during the day. Keep walking most days of the week. Can try walking a few minutes several times a day if not able to walk very long at a time

## 2022-10-04 NOTE — Progress Notes (Signed)
Medical Nutrition Therapy: Visit start time: 0925  end time: 1030  Assessment:   Referral Diagnosis: obesity Other medical history/ diagnoses: chronic kidney disease stage 3a, HTN, hypothyroidism, sleep apnea Psychosocial issues/ stress concerns: history of depression, taking medication regularly  Medications, supplements: reconciled list in medical record   Preferred learning method:  Auditory   Current weight: 259.8 with shoes and wool cape Height: 5'7" BMI: 40.69 Patient's personal weight goal: 210lbs  Progress and evaluation:  Patient reports she has begun restricting hours for eating from 10am - 6pm. She has woken up hungry during the night several times since adopting this pattern.  Has not much dieting over the years; made some changes for weight loss by eating breakfast and then only lettuce/ light salads, lost 12lbs quickly but did not feel well so stopped after a short time.  Recent relevant lab results: 09/23/22 GFR 45, sodium 139, potassium 4.4, creatinine 1.2; HbA1C 5.6% Food allergies: oysters Special diet practices: no Patient seeks help with weight loss, protecting kidneys, heart health Next PCP appt is 01/2023   Dietary Intake:  Usual eating pattern includes 2 meals and 0-1 snacks per day. Dining out frequency: 2 meals per week. Who plans meals/ buys groceries? slef Who prepares meals? self  Breakfast: 9-10am almonds/ walnuts or instant apples cinn oatmeal (sifts out sugar adds a few berries) Snack: none Lunch: usually skips Snack: apple/ fruit Supper: 5pm -- chicken/ burger patty + pinto breans/ sweet potatoes/ brocc// green beans/ collard greens/ tomato; salad with cheese, eggs (premade) adds olive oil Snack: none Beverages: water >100oz daily per MD advice  Physical activity: walking 15 minutes 3x a week, difficult to walk longer at a time   Intervention:   Nutrition Care Education:   Basic nutrition: basic food groups; appropriate nutrient balance;  appropriate meal and snack schedule; general nutrition guidelines    Weight control: importance of low sugar and low fat choices; appropriate food portions; estimated energy needs for weight loss at 1400 kcal, provided guidance for 50% CHO, 20% pro, 30% fat; role of physical activity Kidney disease: avoiding large portions of protein foods, keeping protein intake controlled but at regular intervals; healthy protein foods; low sodium food choices and low sodium seasoning options Hypertension:  importance of controlling BP; identifying high sodium foods; identifying food sources of potassium, magnesium   Other intervention notes: Patient has been working on low sodium intake and caloric reduction.  Established goal of eating at least 3 times a day to ensure adequate nutrition while controlling food portions and avoiding excessive snacking. Established additional goals with input from patient. Scheduled follow up after next lab testing and medical visit, per patient request.   Nutritional Diagnosis:  [place]-2.1 Inpaired nutrition utilization and [place]-2.2 Altered nutrition-related laboratory As related to CKD.  As evidenced by low GFR. [place]-3.3 Overweight/obesity As related to history of excess calories and inadequate physical activity, as well as hypothyroidism.  As evidenced by patient with current BMI of 40, working on diet changes to promote weight loss and reduce health risk.   Education Materials given:  Museum/gallery conservator with food lists, sample meal pattern Sample menus Snacking handout Visit summary with goals/ instructions   Learner/ who was taught:  Patient    Level of understanding: Verbalizes/ demonstrates competency   Demonstrated degree of understanding via:   Teach back Learning barriers: None  Willingness to learn/ readiness for change: Eager, change in progress   Monitoring and Evaluation:  Dietary intake, exercise, renal function, and body weight  follow up:  02/06/23 at  3pm

## 2023-02-06 ENCOUNTER — Encounter: Payer: Self-pay | Admitting: Dietician

## 2023-02-06 ENCOUNTER — Encounter: Payer: Medicare Other | Attending: Internal Medicine | Admitting: Dietician

## 2023-02-06 VITALS — Ht 67.0 in | Wt 257.6 lb

## 2023-02-06 DIAGNOSIS — I129 Hypertensive chronic kidney disease with stage 1 through stage 4 chronic kidney disease, or unspecified chronic kidney disease: Secondary | ICD-10-CM | POA: Diagnosis not present

## 2023-02-06 DIAGNOSIS — I10 Essential (primary) hypertension: Secondary | ICD-10-CM

## 2023-02-06 DIAGNOSIS — Z713 Dietary counseling and surveillance: Secondary | ICD-10-CM | POA: Insufficient documentation

## 2023-02-06 DIAGNOSIS — N1831 Chronic kidney disease, stage 3a: Secondary | ICD-10-CM | POA: Insufficient documentation

## 2023-02-06 DIAGNOSIS — E669 Obesity, unspecified: Secondary | ICD-10-CM | POA: Diagnosis not present

## 2023-02-06 NOTE — Progress Notes (Signed)
Medical Nutrition Therapy: Visit start time: 1500  end time: 1540  Assessment:  Diagnosis: obesity, CKD stage 3a, HTN  Medical history changes: no changes Psychosocial issues/ stress concerns: no depression symptoms at this time  Medications, supplement changes: reconciled list in medical record   Current weight: 257.6lbs Height: 5'7" BMI: 40.35  Progress and evaluation:  Recent labs (01/30/23) indicate HbA1C 5.8%, GFR 45, creatinine 1.2; sodium, potassium normal, blood lipids in normal range with atorvastatin Patient reports eating lunch meal regularly, but sometimes only fruit. She is engaging in exercise on a regular basis She continues to drink over 100oz water daily Patient reports losing some taste for chicken and is not eating chicken as often recently.    Dietary Intake:  Usual eating pattern includes 3 meals and 0-1 snacks per day. Dining out frequency: 2 meals per week.  Breakfast: berries warmed with 2 tbsp oats, occ 1/2 banana, 1 boiled egg most mornings Snack: none Lunch: apple or other fruit; sometimes leftover Snack: none; occ plain pound cake Supper: turnip/ mustard greens, broccoli, cabbage, corn/ butter beans + chicken/ beef Snack: none Beverages: >100oz water daily, occ decaf coffee  Physical activity: elliptical machine or treadmill with arm movement 3-4 times a week, come curls on weight machine  Intervention:   Nutrition Care Education:  Basic nutrition: reviewed appropriate nutrient balance; appropriate meal and snack schedule; general nutrition guidelines    Weight control: reviewed progress since previous visit; appropriate portions of protein foods; physical activity CKD: avoiding sodas (which patient is doing); ensuring adequate protein intake without excess, advised 5-6oz daily of protein foods Hypertension: high sodium foods, choosing low sodium options   Other Intervention Notes: Patient is making gradual positive changes, she is motivated to  continue. Updated goals with input from patient.   Nutritional Diagnosis:  [place]-2.1 Inpaired nutrition utilization and [place]-2.2 Altered nutrition-related laboratory As related to CKD.  As evidenced by low GFR and elevated creatinine. [place]-3.3 Overweight/obesity As related to history of excess calories and inadequate physical activity, hypothyroidism.  As evidenced by patient with current BMI of 40.35.   Education Materials given:  Visit summary with goals/ instructions   Learner/ who was taught:  Patient   Level of understanding: Verbalizes/ demonstrates competency  Demonstrated degree of understanding via:   Teach back Learning barriers: None  Willingness to learn/ readiness for change: Eager, change in progress  Monitoring and Evaluation:  Dietary intake, exercise, renal function, and body weight      follow up:  06/09/23 at 2:45pm

## 2023-02-06 NOTE — Patient Instructions (Addendum)
Eat a small portion -- palm size or a little less-- of a protein food with every meal. Easy options: peanut butter, eggs, light or low fat (2%) cheese including cottage cheese, pinto or other beans, tuna or salmon from can or pouch (look for low sodium)  When eating fruit for lunch or breakfast, make sure to have a protein food with it (see list above) Keep up the regular exercise, great job!

## 2023-06-03 ENCOUNTER — Other Ambulatory Visit (HOSPITAL_COMMUNITY): Payer: Self-pay | Admitting: Internal Medicine

## 2023-06-03 DIAGNOSIS — Z1231 Encounter for screening mammogram for malignant neoplasm of breast: Secondary | ICD-10-CM

## 2023-06-09 ENCOUNTER — Ambulatory Visit: Payer: Medicare Other | Admitting: Dietician

## 2023-07-07 ENCOUNTER — Encounter (HOSPITAL_COMMUNITY): Payer: Self-pay

## 2023-07-07 ENCOUNTER — Ambulatory Visit (HOSPITAL_COMMUNITY)
Admission: RE | Admit: 2023-07-07 | Discharge: 2023-07-07 | Disposition: A | Discharge status: Home / Self Care | Payer: Medicare Other | Source: Ambulatory Visit | Attending: Internal Medicine | Admitting: Internal Medicine

## 2023-07-07 DIAGNOSIS — Z1231 Encounter for screening mammogram for malignant neoplasm of breast: Secondary | ICD-10-CM | POA: Insufficient documentation

## 2023-07-14 ENCOUNTER — Encounter: Payer: Self-pay | Admitting: Dietician

## 2023-07-14 NOTE — Progress Notes (Signed)
Have not heard back from patient to reschedule her missed appointment from 06/09/23. Sent notification to referring provider.

## 2024-02-21 ENCOUNTER — Emergency Department (HOSPITAL_COMMUNITY)

## 2024-02-21 ENCOUNTER — Encounter (HOSPITAL_COMMUNITY): Payer: Self-pay | Admitting: Emergency Medicine

## 2024-02-21 ENCOUNTER — Emergency Department (HOSPITAL_COMMUNITY)
Admission: EM | Admit: 2024-02-21 | Discharge: 2024-02-21 | Disposition: A | Discharge status: Home / Self Care | Attending: Emergency Medicine | Admitting: Emergency Medicine

## 2024-02-21 ENCOUNTER — Other Ambulatory Visit: Payer: Self-pay

## 2024-02-21 DIAGNOSIS — Z79899 Other long term (current) drug therapy: Secondary | ICD-10-CM | POA: Insufficient documentation

## 2024-02-21 DIAGNOSIS — S0083XA Contusion of other part of head, initial encounter: Secondary | ICD-10-CM | POA: Insufficient documentation

## 2024-02-21 DIAGNOSIS — Y92019 Unspecified place in single-family (private) house as the place of occurrence of the external cause: Secondary | ICD-10-CM | POA: Insufficient documentation

## 2024-02-21 DIAGNOSIS — W19XXXA Unspecified fall, initial encounter: Secondary | ICD-10-CM | POA: Insufficient documentation

## 2024-02-21 DIAGNOSIS — S0990XA Unspecified injury of head, initial encounter: Secondary | ICD-10-CM | POA: Diagnosis present

## 2024-02-21 DIAGNOSIS — I10 Essential (primary) hypertension: Secondary | ICD-10-CM | POA: Diagnosis not present

## 2024-02-21 LAB — CBG MONITORING, ED: Glucose-Capillary: 108 mg/dL — ABNORMAL HIGH (ref 70–99)

## 2024-02-21 MED ORDER — ACETAMINOPHEN 500 MG PO TABS
1000.0000 mg | ORAL_TABLET | Freq: Once | ORAL | Status: AC
  Administered 2024-02-21: 1000 mg via ORAL
  Filled 2024-02-21: qty 2
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
## 2024-02-21 NOTE — ED Provider Notes (Signed)
 [facility] AT [facility] Provider Note   CSN: [id number] Arrival date & time: 02/21/24  0231     History  Chief Complaint  Patient presents with   [name] is a 88 y.o. female with PMH as listed below who presents with fall, head injury.   Pt states she had fall at home (denies any dizziness or LOC) after her knee "gave out" as she was bending over. Pt hit forehead on space heater. Presents with hematoma to R forehead area.  Did not have any loss of consciousness.  Denies neck pain, chest pain, palpitations.  Endorses she is otherwise in her normal state of health.  Does not take a blood thinner.  Endorses moderate headache.     Past Medical History:  Diagnosis Date   Arthritis    GERD (gastroesophageal reflux disease)    occ   Hyperlipidemia    Hypertension    Sleep apnea    cpap 3 yrs no problems use 1/2 tx       Home Medications Prior to Admission medications   Medication Sig Start Date End Date Taking? Authorizing Provider  amLODipine (NORVASC) 10 MG tablet Take 1 tablet (10 mg total) by mouth daily. 12/13/15   Collene Gobble, MD  amoxicillin (AMOXIL) 500 MG tablet Take 2 tablets (1,000 mg total) by mouth 2 (two) times daily. 03/29/16   Ethelda Chick, MD  B Complex Vitamins (VITAMIN B COMPLEX PO) Vitamin B complex    [provider]  Cholecalciferol (VITAMIN D-1000 MAX ST) 25 MCG (1000 UT) tablet Take by mouth.    [provider]  citalopram (CELEXA) 10 MG tablet Take 1 tablet by mouth daily. 01/08/22 01/08/23  [provider]  Coenzyme Q10 (CO Q 10 PO) Take 1 capsule by mouth daily. Reported on 12/13/2015    [provider]  dexamethasone (DECADRON) 4 MG tablet Take 4 mg by mouth 2 (two) times daily with a meal. Reported on 03/29/2016    [provider]  Inositol Niacinate (NIACIN FLUSH FREE PO) Niacin    [provider]  Lactobacillus (PROBIOTIC ACIDOPHILUS PO) Take 1  capsule by mouth daily.    [provider]  levothyroxine (SYNTHROID) 25 MCG tablet Take by mouth. 09/24/22 12/23/22  [provider]  losartan (COZAAR) 100 MG tablet Take 1 tablet by mouth daily. 08/22/22   [provider]  metoprolol succinate (TOPROL-XL) 25 MG 24 hr tablet Take 1 tablet by mouth daily. 02/06/23 02/06/24  [provider]  niacin (VITAMIN B3) 500 MG tablet Take by mouth.    [provider]  oxybutynin (DITROPAN) 5 MG tablet Take 5 mg by mouth 3 (three) times daily.    [provider]  potassium chloride SA (KLOR-CON M20) 20 MEQ tablet Take 1 tablet (20 mEq total) by mouth daily. 12/13/15   Collene Gobble, MD  triamterene-hydrochlorothiazide (MAXZIDE-25) 37.5-25 MG tablet Take 1 tablet by mouth daily. 12/13/15   Collene Gobble, MD      Allergies    Other and Laqueta Jean allergy]    Review of Systems   Review of Systems A 10 point review of systems was performed and is negative unless otherwise reported in HPI.  Physical Exam Updated Vital Signs BP (!) 169/66 (BP Location: Left Arm)   Pulse 67   Temp 98 F (36.7 C) (Oral)   Resp 18   Ht 5\' 7"  (1.702 m)   Wt  111.1 kg   SpO2 97%   BMI 38.37 kg/m  Physical Exam General: Normal appearing female, lying in bed.  HEENT: 5x5 cm mild to moderate hematoma to right forehead. No skull depressions or deformities.  PERRLA, EOMI, no facial trauma noted.  Sclera anicteric, MMM, trachea midline.  No midline C-spine tender palpation step-offs or deformities. Cardiology: No chest wall tenderness to palpation.  RRR, no murmurs/rubs/gallops. BL radial and DP pulses equal bilaterally.  Resp: Normal respiratory rate and effort. CTAB, no wheezes, rhonchi, crackles.  Abd: Soft, non-tender, non-distended. No rebound tenderness or guarding.  GU: Deferred. MSK: Mild ecchymosis on posterior right forearm and lateral right upper arm with no bony deformities or significant tenderness to  palpation.  Symmetric bilateral 2+ peripheral edema .no signs of trauma. Extremities without deformity or TTP. No cyanosis or clubbing. Skin: warm, dry.  Neuro: A&Ox4, CNs II-XII grossly intact. MAEs. Sensation grossly intact.  Psych: Normal mood and affect.   ED Results / Procedures / Treatments   Labs (all labs ordered are listed, but only abnormal results are displayed) Labs Reviewed  CBG MONITORING, ED - Abnormal; Notable for the following components:      Result Value   Glucose-Capillary 108 (*)    All other components within normal limits    EKG None  Radiology CT Head Wo Contrast Result Date: 02/21/2024 CLINICAL DATA:  Head trauma.  Fall. EXAM: CT HEAD WITHOUT CONTRAST CT CERVICAL SPINE WITHOUT CONTRAST TECHNIQUE: Multidetector CT imaging of the head and cervical spine was performed following the standard protocol without intravenous contrast. Multiplanar CT image reconstructions of the cervical spine were also generated. RADIATION DOSE REDUCTION: This exam was performed according to the departmental dose-optimization program which includes automated exposure control, adjustment of the mA and/or kV according to patient size and/or use of iterative reconstruction technique. COMPARISON:  None Available. FINDINGS: CT HEAD FINDINGS Brain: No mass,hemorrhage or extra-axial collection. Normal appearance of the parenchyma and CSF spaces. Vascular: Atherosclerotic calcification of the internal carotid arteries at the skull base. No abnormal hyperdensity of the major intracranial arteries or dural venous sinuses. Skull: Right frontal scalp swelling.  No skull fracture. Sinuses/Orbits: No fluid levels or advanced mucosal thickening of the visualized paranasal sinuses. No mastoid or middle ear effusion. Normal orbits. Other: None. CT CERVICAL SPINE FINDINGS Alignment: Reversal of normal cervical lordosis. Skull base and vertebrae: No acute fracture. Soft tissues and spinal canal: No prevertebral fluid  or swelling. No visible canal hematoma. Disc levels: No advanced spinal canal or neural foraminal stenosis. Upper chest: No pneumothorax, pulmonary nodule or pleural effusion. Other: Normal visualized paraspinal cervical soft tissues. IMPRESSION: 1. No acute intracranial abnormality. 2. Right frontal scalp swelling without skull fracture. 3. No acute fracture or static subluxation of the cervical spine. Electronically Signed   By: Deatra Robinson M.D.   On: 02/21/2024 03:55   CT Cervical Spine Wo Contrast Result Date: 02/21/2024 CLINICAL DATA:  Head trauma.  Fall. EXAM: CT HEAD WITHOUT CONTRAST CT CERVICAL SPINE WITHOUT CONTRAST TECHNIQUE: Multidetector CT imaging of the head and cervical spine was performed following the standard protocol without intravenous contrast. Multiplanar CT image reconstructions of the cervical spine were also generated. RADIATION DOSE REDUCTION: This exam was performed according to the departmental dose-optimization program which includes automated exposure control, adjustment of the mA and/or kV according to patient size and/or use of iterative reconstruction technique. COMPARISON:  None Available. FINDINGS: CT HEAD FINDINGS Brain: No mass,hemorrhage or extra-axial collection. Normal appearance of the parenchyma and  CSF spaces. Vascular: Atherosclerotic calcification of the internal carotid arteries at the skull base. No abnormal hyperdensity of the major intracranial arteries or dural venous sinuses. Skull: Right frontal scalp swelling.  No skull fracture. Sinuses/Orbits: No fluid levels or advanced mucosal thickening of the visualized paranasal sinuses. No mastoid or middle ear effusion. Normal orbits. Other: None. CT CERVICAL SPINE FINDINGS Alignment: Reversal of normal cervical lordosis. Skull base and vertebrae: No acute fracture. Soft tissues and spinal canal: No prevertebral fluid or swelling. No visible canal hematoma. Disc levels: No advanced spinal canal or neural foraminal  stenosis. Upper chest: No pneumothorax, pulmonary nodule or pleural effusion. Other: Normal visualized paraspinal cervical soft tissues. IMPRESSION: 1. No acute intracranial abnormality. 2. Right frontal scalp swelling without skull fracture. 3. No acute fracture or static subluxation of the cervical spine. Electronically Signed   By: Deatra Robinson M.D.   On: 02/21/2024 03:55    Procedures Procedures    Medications Ordered in ED Medications  acetaminophen (TYLENOL) tablet 1,000 mg (has no administration in time range)    ED Course/ Medical Decision Making/ A&P                          Medical Decision Making Amount and/or Complexity of Data Reviewed Radiology: ordered. Decision-making details documented in ED Course.  Risk OTC drugs.    This patient presents to the ED for concern of fall, head trauma, this involves an extensive number of treatment options, and is a complaint that carries with it a high risk of complications and morbidity.  I considered the following differential and admission for this acute, potentially life threatening condition.   MDM:    DDX for trauma includes but is not limited to:  -Head Injury such as skull fx or ICH - CT reassuringly negative. Does have hematoma to R forehead -Spinal Cord or Vertebral injury - neg CT c-spine, no FNDs  Patient states she just lost her balance and fell forward.  Otherwise in her normal state of health.  EKG similar to prior and POC glucose also normal.  Forehead hematoma is mild to moderate in size and not expanding. Advised that it will take several weeks to improve and she may get bruising in her face. Patient given Tylenol for her headache and instructed to take Tylenol 1 g every 8 hours at home.  Given discharge instructions and return precautions, all questions answered to patient satisfaction.   Clinical Course as of 02/21/24 0430  Sat Feb 21, 2024  0359 CT Head Wo Contrast 1. No acute intracranial abnormality. 2.  Right frontal scalp swelling without skull fracture. 3. No acute fracture or static subluxation of the cervical spine.   [HN]    Clinical Course User Index [HN] Loetta Rough, MD    Labs: I Ordered, and personally interpreted labs.  The pertinent results include:  POC glucose unremarkable  Imaging Studies ordered: I ordered imaging studies including CTH, CT C-spine I independently visualized and interpreted imaging. I agree with the radiologist interpretation  Additional history obtained from chart review, family at bedside.    Social Determinants of Health:  lives independently  Disposition:  DC  Co morbidities that complicate the patient evaluation  Past Medical History:  Diagnosis Date   Arthritis    GERD (gastroesophageal reflux disease)    occ   Hyperlipidemia    Hypertension    Sleep apnea    cpap 3 yrs no problems use 1/2 tx  Medicines Meds ordered this encounter  Medications   acetaminophen (TYLENOL) tablet 1,000 mg    I have reviewed the patients home medicines and have made adjustments as needed  Problem List / ED Course: Problem List Items Addressed This Visit   None Visit Diagnoses       Fall in home, initial encounter    -  Primary     Traumatic hematoma of forehead, initial encounter                       This note was created using dictation software, which may contain spelling or grammatical errors.    Loetta Rough, MD 02/21/24 [phone]

## 2024-02-21 NOTE — ED Triage Notes (Signed)
 Pt states she had fall at home (denies any dizziness or LOC) after her knee "gave out". Pt hit forehead on space heater. Presents with hematoma to R forehead area.

## 2024-04-20 ENCOUNTER — Encounter: Payer: Self-pay | Admitting: Internal Medicine

## 2024-04-20 DIAGNOSIS — R0989 Other specified symptoms and signs involving the circulatory and respiratory systems: Secondary | ICD-10-CM

## 2024-04-21 ENCOUNTER — Other Ambulatory Visit: Payer: Self-pay | Admitting: Internal Medicine

## 2024-04-21 DIAGNOSIS — R0989 Other specified symptoms and signs involving the circulatory and respiratory systems: Secondary | ICD-10-CM

## 2024-04-28 ENCOUNTER — Ambulatory Visit
Admission: RE | Admit: 2024-04-28 | Discharge: 2024-04-28 | Disposition: A | Discharge status: Home / Self Care | Source: Ambulatory Visit | Attending: Internal Medicine | Admitting: Internal Medicine

## 2024-04-28 DIAGNOSIS — R0989 Other specified symptoms and signs involving the circulatory and respiratory systems: Secondary | ICD-10-CM | POA: Insufficient documentation

## 2024-05-07 ENCOUNTER — Encounter: Payer: Self-pay | Admitting: Intensive Care

## 2024-05-07 ENCOUNTER — Emergency Department

## 2024-05-07 ENCOUNTER — Other Ambulatory Visit: Payer: Self-pay

## 2024-05-07 ENCOUNTER — Emergency Department
Admission: EM | Admit: 2024-05-07 | Discharge: 2024-05-07 | Disposition: A | Discharge status: Home / Self Care | Attending: Emergency Medicine | Admitting: Emergency Medicine

## 2024-05-07 DIAGNOSIS — I10 Essential (primary) hypertension: Secondary | ICD-10-CM | POA: Diagnosis not present

## 2024-05-07 DIAGNOSIS — R1031 Right lower quadrant pain: Secondary | ICD-10-CM | POA: Diagnosis present

## 2024-05-07 DIAGNOSIS — R109 Unspecified abdominal pain: Secondary | ICD-10-CM

## 2024-05-07 LAB — URINALYSIS, ROUTINE W REFLEX MICROSCOPIC
Bacteria, UA: NONE SEEN
Bilirubin Urine: NEGATIVE
Glucose, UA: NEGATIVE mg/dL
Hgb urine dipstick: NEGATIVE
Ketones, ur: NEGATIVE mg/dL
Nitrite: NEGATIVE
Protein, ur: NEGATIVE mg/dL
Specific Gravity, Urine: 1.014 (ref 1.005–1.030)
pH: 5 (ref 5.0–8.0)

## 2024-05-07 LAB — COMPREHENSIVE METABOLIC PANEL WITH GFR
ALT: 15 U/L (ref 0–44)
AST: 19 U/L (ref 15–41)
Albumin: 3.9 g/dL (ref 3.5–5.0)
Alkaline Phosphatase: 51 U/L (ref 38–126)
Anion gap: 7 (ref 5–15)
BUN: 29 mg/dL — ABNORMAL HIGH (ref 8–23)
CO2: 24 mmol/L (ref 22–32)
Calcium: 9.3 mg/dL (ref 8.9–10.3)
Chloride: 107 mmol/L (ref 98–111)
Creatinine, Ser: 1.17 mg/dL — ABNORMAL HIGH (ref 0.44–1.00)
GFR, Estimated: 46 mL/min — ABNORMAL LOW (ref >60)
Glucose, Bld: 108 mg/dL — ABNORMAL HIGH (ref 70–99)
Potassium: 4.5 mmol/L (ref 3.5–5.1)
Sodium: 138 mmol/L (ref 135–145)
Total Bilirubin: 1.3 mg/dL — ABNORMAL HIGH (ref 0.0–1.2)
Total Protein: 7.3 g/dL (ref 6.5–8.1)

## 2024-05-07 LAB — CBC
HCT: 40 % (ref 36.0–46.0)
Hemoglobin: 13.2 g/dL (ref 12.0–15.0)
MCH: 31.8 pg (ref 26.0–34.0)
MCHC: 33 g/dL (ref 30.0–36.0)
MCV: 96.4 fL (ref 80.0–100.0)
Platelets: 182 10*3/uL (ref 150–400)
RBC: 4.15 MIL/uL (ref 3.87–5.11)
RDW: 13 % (ref 11.5–15.5)
WBC: 8.2 10*3/uL (ref 4.0–10.5)
nRBC: 0 % (ref 0.0–0.2)

## 2024-05-07 LAB — LIPASE, BLOOD: Lipase: 45 U/L (ref 11–51)

## 2024-05-07 MED ORDER — IOHEXOL 300 MG/ML  SOLN
100.0000 mL | Freq: Once | INTRAMUSCULAR | Status: AC | PRN
  Administered 2024-05-07: 100 mL via INTRAVENOUS

## 2024-05-07 MED ORDER — CEPHALEXIN 500 MG PO CAPS
500.0000 mg | ORAL_CAPSULE | Freq: Two times a day (BID) | ORAL | 0 refills | Status: DC
Start: 2024-05-07 — End: 2024-05-17

## 2024-05-07 MED ORDER — TRAMADOL HCL 50 MG PO TABS: 50.0000 mg | ORAL_TABLET | Freq: Four times a day (QID) | ORAL | 0 refills | Status: AC | PRN

## 2024-05-07 MED ORDER — CEPHALEXIN 500 MG PO CAPS
500.0000 mg | ORAL_CAPSULE | Freq: Once | ORAL | Status: AC
  Administered 2024-05-07: 500 mg via ORAL
  Filled 2024-05-07: qty 1

## 2024-05-07 NOTE — ED Provider Notes (Signed)
 [facility] Provider Note    Event Date/Time   First MD Initiated Contact with Patient 05/07/24 1435     (approximate)  History   Chief Complaint: Abdominal Pain  HPI  [name] is a 88 y.o. female with a past medical history of arthritis, gastric reflux, hypertension, hyperlipidemia, presents to the emergency department for right lower quadrant abdominal pain.  According to the patient for the past 3 days she has been experiencing pain in the right lower quadrant.  Patient denies any nausea vomiting diarrhea or urinary symptoms.  No fever.  Physical Exam   Triage Vital Signs: ED Triage Vitals  Encounter Vitals Group     BP 05/07/24 1342 (!) 157/65     Girls Systolic BP Percentile --      Girls Diastolic BP Percentile --      Boys Systolic BP Percentile --      Boys Diastolic BP Percentile --      Pulse Rate 05/07/24 1342 (!) 54     Resp 05/07/24 1342 15     Temp 05/07/24 1342 98.2 F (36.8 C)     Temp Source 05/07/24 1342 Oral     SpO2 05/07/24 1342 100 %     Weight 05/07/24 1343 257 lb (116.6 kg)     Height 05/07/24 1343 5' 7 (1.702 m)     Head Circumference --      Peak Flow --      Pain Score 05/07/24 1343 7     Pain Loc --      Pain Education --      Exclude from Growth Chart --     Most recent vital signs: Vitals:   05/07/24 1342  BP: (!) 157/65  Pulse: (!) 54  Resp: 15  Temp: 98.2 F (36.8 C)  SpO2: 100%    General: Awake, no distress.  CV:  Good peripheral perfusion.  Regular rate and rhythm  Resp:  Normal effort.  Equal breath sounds bilaterally.  Abd:  No distention.  Soft, nontender.  No rebound or guarding.  No reaction to abdominal palpation.   ED Results / Procedures / Treatments   RADIOLOGY  I have reviewed and interpreted the CT images.  Patient appears to have large bilateral renal cysts left greater than right. Radiology has read multiple renal cysts no follow-up needed.  No acute  finding.  MEDICATIONS ORDERED IN ED: Medications - No data to display   IMPRESSION / MDM / ASSESSMENT AND PLAN / ED COURSE  I reviewed the triage vital signs and the nursing notes.  Patient's presentation is most consistent with acute presentation with potential threat to life or bodily function.  Patient presents to the emergency department right lower quadrant abdominal pain.  According to the patient over the past 3 days she has been experiencing a vague pain in the right lower quadrant.  Overall reassuring physical exam and vital signs.  Patient's lab work shows a reassuring CBC with a normal white blood cell count, reassuring chemistry with normal LFTs and a normal lipase.  Urinalysis pending.  However given the patient's complaint of right lower quadrant pain we will proceed with a CT scan with contrast to further evaluate and rule out appendicitis among other intra-abdominal pathology such as diverticulitis colitis ureterolithiasis or pyelonephritis.  CT is essentially negative for acute finding.  There are large renal cysts but radiology is reading them as benign renal cyst with no follow-up needed.  Patient has a  small amount of blood in her urine we will send a urine culture and start the patient on antibiotic as a precaution.  Will prescribe a short course of tramadol to be used if needed.  Patient agreeable to plan of care.  Family agreeable as well.  FINAL CLINICAL IMPRESSION(S) / ED DIAGNOSES   Right lower quadrant abdominal pain   Note:  This document was prepared using Dragon voice recognition software and may include unintentional dictation errors.   Ruth Cove, MD 05/07/24 1850

## 2024-05-07 NOTE — ED Triage Notes (Signed)
 Patient brought over from [facility] for RLQ pain X3 days. Reports intermittent and dull ache   Denies N/V/D  Denies urinary symptoms

## 2024-05-07 NOTE — Discharge Instructions (Signed)
 Please take your antibiotics for possible urinary infection as prescribed for the next 5 days.  As we discussed you may take Tylenol  as written on the box.  If you are experiencing significant pain you may take tramadol in addition to the Tylenol .  Do not take tramadol/pain medication while driving or drinking alcohol.

## 2024-05-08 LAB — URINE CULTURE: Culture: <10000 — AB

## 2024-05-16 ENCOUNTER — Encounter (HOSPITAL_COMMUNITY): Payer: Self-pay | Admitting: Emergency Medicine

## 2024-05-16 ENCOUNTER — Emergency Department (HOSPITAL_COMMUNITY)

## 2024-05-16 ENCOUNTER — Observation Stay (HOSPITAL_COMMUNITY)
Admission: EM | Admit: 2024-05-16 | Discharge: 2024-05-17 | Disposition: A | Discharge status: Home / Self Care | Attending: Family Medicine | Admitting: Family Medicine

## 2024-05-16 ENCOUNTER — Other Ambulatory Visit: Payer: Self-pay

## 2024-05-16 DIAGNOSIS — Z6841 Body Mass Index (BMI) 40.0 and over, adult: Secondary | ICD-10-CM | POA: Insufficient documentation

## 2024-05-16 DIAGNOSIS — E860 Dehydration: Secondary | ICD-10-CM | POA: Insufficient documentation

## 2024-05-16 DIAGNOSIS — N1832 Chronic kidney disease, stage 3b: Secondary | ICD-10-CM | POA: Insufficient documentation

## 2024-05-16 DIAGNOSIS — E782 Mixed hyperlipidemia: Secondary | ICD-10-CM | POA: Insufficient documentation

## 2024-05-16 DIAGNOSIS — R0789 Other chest pain: Secondary | ICD-10-CM | POA: Diagnosis present

## 2024-05-16 DIAGNOSIS — K219 Gastro-esophageal reflux disease without esophagitis: Secondary | ICD-10-CM | POA: Insufficient documentation

## 2024-05-16 DIAGNOSIS — Z79899 Other long term (current) drug therapy: Secondary | ICD-10-CM | POA: Insufficient documentation

## 2024-05-16 DIAGNOSIS — N179 Acute kidney failure, unspecified: Secondary | ICD-10-CM | POA: Diagnosis not present

## 2024-05-16 DIAGNOSIS — D649 Anemia, unspecified: Secondary | ICD-10-CM | POA: Insufficient documentation

## 2024-05-16 DIAGNOSIS — I10 Essential (primary) hypertension: Secondary | ICD-10-CM | POA: Insufficient documentation

## 2024-05-16 DIAGNOSIS — G4733 Obstructive sleep apnea (adult) (pediatric): Secondary | ICD-10-CM | POA: Insufficient documentation

## 2024-05-16 DIAGNOSIS — I129 Hypertensive chronic kidney disease with stage 1 through stage 4 chronic kidney disease, or unspecified chronic kidney disease: Secondary | ICD-10-CM | POA: Insufficient documentation

## 2024-05-16 DIAGNOSIS — R072 Precordial pain: Secondary | ICD-10-CM | POA: Diagnosis not present

## 2024-05-16 DIAGNOSIS — E66813 Obesity, class 3: Secondary | ICD-10-CM | POA: Diagnosis not present

## 2024-05-16 DIAGNOSIS — R112 Nausea with vomiting, unspecified: Secondary | ICD-10-CM | POA: Insufficient documentation

## 2024-05-16 LAB — BASIC METABOLIC PANEL WITH GFR
Anion gap: 13 (ref 5–15)
BUN: 27 mg/dL — ABNORMAL HIGH (ref 8–23)
CO2: 21 mmol/L — ABNORMAL LOW (ref 22–32)
Calcium: 9.7 mg/dL (ref 8.9–10.3)
Chloride: 104 mmol/L (ref 98–111)
Creatinine, Ser: 1.55 mg/dL — ABNORMAL HIGH (ref 0.44–1.00)
GFR, Estimated: 33 mL/min — ABNORMAL LOW (ref >60)
Glucose, Bld: 169 mg/dL — ABNORMAL HIGH (ref 70–99)
Potassium: 4.3 mmol/L (ref 3.5–5.1)
Sodium: 138 mmol/L (ref 135–145)

## 2024-05-16 LAB — CBC
HCT: 38.4 % (ref 36.0–46.0)
Hemoglobin: 12.6 g/dL (ref 12.0–15.0)
MCH: 31.3 pg (ref 26.0–34.0)
MCHC: 32.8 g/dL (ref 30.0–36.0)
MCV: 95.5 fL (ref 80.0–100.0)
Platelets: 171 10*3/uL (ref 150–400)
RBC: 4.02 MIL/uL (ref 3.87–5.11)
RDW: 13.2 % (ref 11.5–15.5)
WBC: 9.8 10*3/uL (ref 4.0–10.5)
nRBC: 0 % (ref 0.0–0.2)

## 2024-05-16 LAB — TROPONIN I (HIGH SENSITIVITY): Troponin I (High Sensitivity): 15 ng/L (ref <18)

## 2024-05-16 MED ORDER — SODIUM CHLORIDE 0.9 % IV BOLUS
1000.0000 mL | Freq: Once | INTRAVENOUS | Status: AC
  Administered 2024-05-17: 1000 mL via INTRAVENOUS

## 2024-05-16 NOTE — ED Triage Notes (Signed)
 Pt via POV c/o central chest pain and vomiting since 3pm this afternoon. Pt has also had sweaty spells. CP rated 2/10 intermittent, worse when vomiting, with radiation to her back. Hx palpitations.

## 2024-05-17 ENCOUNTER — Observation Stay (HOSPITAL_COMMUNITY)

## 2024-05-17 ENCOUNTER — Emergency Department (HOSPITAL_COMMUNITY)

## 2024-05-17 DIAGNOSIS — R001 Bradycardia, unspecified: Secondary | ICD-10-CM

## 2024-05-17 DIAGNOSIS — R0789 Other chest pain: Secondary | ICD-10-CM | POA: Diagnosis not present

## 2024-05-17 DIAGNOSIS — E66813 Obesity, class 3: Secondary | ICD-10-CM | POA: Diagnosis not present

## 2024-05-17 DIAGNOSIS — R112 Nausea with vomiting, unspecified: Secondary | ICD-10-CM | POA: Insufficient documentation

## 2024-05-17 DIAGNOSIS — R072 Precordial pain: Secondary | ICD-10-CM | POA: Diagnosis not present

## 2024-05-17 DIAGNOSIS — I10 Essential (primary) hypertension: Secondary | ICD-10-CM | POA: Diagnosis not present

## 2024-05-17 DIAGNOSIS — N179 Acute kidney failure, unspecified: Secondary | ICD-10-CM | POA: Diagnosis not present

## 2024-05-17 DIAGNOSIS — R079 Chest pain, unspecified: Secondary | ICD-10-CM | POA: Diagnosis not present

## 2024-05-17 DIAGNOSIS — E785 Hyperlipidemia, unspecified: Secondary | ICD-10-CM

## 2024-05-17 DIAGNOSIS — R002 Palpitations: Secondary | ICD-10-CM

## 2024-05-17 DIAGNOSIS — G4733 Obstructive sleep apnea (adult) (pediatric): Secondary | ICD-10-CM

## 2024-05-17 DIAGNOSIS — E782 Mixed hyperlipidemia: Secondary | ICD-10-CM | POA: Insufficient documentation

## 2024-05-17 LAB — COMPREHENSIVE METABOLIC PANEL WITH GFR
ALT: 13 U/L (ref 0–44)
AST: 18 U/L (ref 15–41)
Albumin: 3.2 g/dL — ABNORMAL LOW (ref 3.5–5.0)
Alkaline Phosphatase: 50 U/L (ref 38–126)
Anion gap: 10 (ref 5–15)
BUN: 22 mg/dL (ref 8–23)
CO2: 20 mmol/L — ABNORMAL LOW (ref 22–32)
Calcium: 9 mg/dL (ref 8.9–10.3)
Chloride: 108 mmol/L (ref 98–111)
Creatinine, Ser: 1.24 mg/dL — ABNORMAL HIGH (ref 0.44–1.00)
GFR, Estimated: 43 mL/min — ABNORMAL LOW (ref >60)
Glucose, Bld: 121 mg/dL — ABNORMAL HIGH (ref 70–99)
Potassium: 4.2 mmol/L (ref 3.5–5.1)
Sodium: 138 mmol/L (ref 135–145)
Total Bilirubin: 0.9 mg/dL (ref 0.0–1.2)
Total Protein: 6.1 g/dL — ABNORMAL LOW (ref 6.5–8.1)

## 2024-05-17 LAB — HEPATIC FUNCTION PANEL
ALT: 15 U/L (ref 0–44)
AST: 21 U/L (ref 15–41)
Albumin: 3.7 g/dL (ref 3.5–5.0)
Alkaline Phosphatase: 53 U/L (ref 38–126)
Bilirubin, Direct: 0.2 mg/dL (ref 0.0–0.2)
Indirect Bilirubin: 1 mg/dL — ABNORMAL HIGH (ref 0.3–0.9)
Total Bilirubin: 1.2 mg/dL (ref 0.0–1.2)
Total Protein: 6.7 g/dL (ref 6.5–8.1)

## 2024-05-17 LAB — CBC
HCT: 35.8 % — ABNORMAL LOW (ref 36.0–46.0)
Hemoglobin: 11.8 g/dL — ABNORMAL LOW (ref 12.0–15.0)
MCH: 33 pg (ref 26.0–34.0)
MCHC: 33 g/dL (ref 30.0–36.0)
MCV: 100 fL (ref 80.0–100.0)
Platelets: 152 10*3/uL (ref 150–400)
RBC: 3.58 MIL/uL — ABNORMAL LOW (ref 3.87–5.11)
RDW: 13.2 % (ref 11.5–15.5)
WBC: 8.2 10*3/uL (ref 4.0–10.5)
nRBC: 0 % (ref 0.0–0.2)

## 2024-05-17 LAB — ECHOCARDIOGRAM COMPLETE
AR max vel: 2.79 cm2
AV Area VTI: 2.78 cm2
AV Area mean vel: 2.78 cm2
AV Mean grad: 7 mmHg
AV Peak grad: 13 mmHg
Ao pk vel: 1.8 m/s
Area-P 1/2: 4.15 cm2
Height: 67 in
S' Lateral: 3 cm
Weight: 4091.74 [oz_av]

## 2024-05-17 LAB — LIPASE, BLOOD: Lipase: 37 U/L (ref 11–51)

## 2024-05-17 LAB — TROPONIN I (HIGH SENSITIVITY)
Troponin I (High Sensitivity): 60 ng/L — ABNORMAL HIGH (ref <18)
Troponin I (High Sensitivity): 51 ng/L — ABNORMAL HIGH (ref <18)
Troponin I (High Sensitivity): 42 ng/L — ABNORMAL HIGH (ref <18)

## 2024-05-17 LAB — MAGNESIUM: Magnesium: 2.1 mg/dL (ref 1.7–2.4)

## 2024-05-17 LAB — PHOSPHORUS: Phosphorus: 3.1 mg/dL (ref 2.5–4.6)

## 2024-05-17 LAB — D-DIMER, QUANTITATIVE: D-Dimer, Quant: 1.18 ug{FEU}/mL — ABNORMAL HIGH (ref 0.00–0.50)

## 2024-05-17 MED ORDER — ACETAMINOPHEN 650 MG RE SUPP
650.0000 mg | Freq: Four times a day (QID) | RECTAL | Status: DC | PRN
Start: 2024-05-17 — End: 2024-05-17

## 2024-05-17 MED ORDER — ONDANSETRON HCL 4 MG/2ML IJ SOLN: 4.0000 mg | Freq: Four times a day (QID) | INTRAMUSCULAR | Status: DC | PRN

## 2024-05-17 MED ORDER — ASPIRIN 81 MG PO CHEW
324.0000 mg | CHEWABLE_TABLET | Freq: Once | ORAL | Status: AC
  Administered 2024-05-17: 324 mg via ORAL
  Filled 2024-05-17: qty 4

## 2024-05-17 MED ORDER — ENOXAPARIN SODIUM 40 MG/0.4ML IJ SOSY
40.0000 mg | PREFILLED_SYRINGE | INTRAMUSCULAR | Status: DC
  Administered 2024-05-17: 40 mg via SUBCUTANEOUS
  Filled 2024-05-17: qty 0.4

## 2024-05-17 MED ORDER — ONDANSETRON HCL 4 MG PO TABS: 4.0000 mg | ORAL_TABLET | Freq: Four times a day (QID) | ORAL | Status: DC | PRN

## 2024-05-17 MED ORDER — NIACIN 500 MG PO TABS
500.0000 mg | ORAL_TABLET | Freq: Every day | ORAL | Status: DC
  Filled 2024-05-17: qty 1

## 2024-05-17 MED ORDER — LOSARTAN POTASSIUM 50 MG PO TABS
100.0000 mg | ORAL_TABLET | Freq: Every day | ORAL | Status: DC
  Administered 2024-05-17: 100 mg via ORAL
  Filled 2024-05-17: qty 2

## 2024-05-17 MED ORDER — IOHEXOL 350 MG/ML SOLN
60.0000 mL | Freq: Once | INTRAVENOUS | Status: AC | PRN
  Administered 2024-05-17: 60 mL via INTRAVENOUS

## 2024-05-17 MED ORDER — ACETAMINOPHEN 325 MG PO TABS: 650.0000 mg | ORAL_TABLET | Freq: Four times a day (QID) | ORAL | Status: DC | PRN

## 2024-05-17 MED ORDER — ACETAMINOPHEN 325 MG PO TABS: 650.0000 mg | ORAL_TABLET | Freq: Four times a day (QID) | ORAL | Status: AC | PRN

## 2024-05-17 MED ORDER — AMLODIPINE BESYLATE 5 MG PO TABS
10.0000 mg | ORAL_TABLET | Freq: Every day | ORAL | Status: DC
Start: 2024-05-17 — End: 2024-05-17
  Administered 2024-05-17: 10 mg via ORAL
  Filled 2024-05-17: qty 2

## 2024-05-17 NOTE — H&P (Signed)
 History and Physical    Patient: [name] FMW:[id number] DOB: [birth year] DOA: 05/16/2024 DOS: the patient was seen and examined on 05/17/2024 PCP: Sadie Manna, MD  Patient coming from: Home  Chief Complaint:  Chief Complaint  Patient presents with   Chest Pain   Emesis   HPI: [name] is a 88 y.o. female with medical history significant of hypertension, OSA on CPAP who presents to the emergency department due to chest pain.  She complained of left-sided chest pain which started around 3 PM yesterday with radiation to her shoulder blade.  Pain is reproducible component and was rated as 5-6/10 on pain scale, it was associated with nausea and vomiting.  Son at bedside states that patient has not been using the air conditioner during excessive heat and has been sweaty at times.  Patient endorsed lightheadedness upon standing without passing out and also reports mild headache.  ED Course:  In the emergency department, BP was 148/62, other vital signs are within normal range.  Workup in the ED showed normal CBC and BMP except for CO2 of 21, blood glucose 169, BUN 27, creatinine 1.55 (this was 1.2 about 10 days ago).  Troponin 15 > 60, lipase 37, D-dimer 1.18. CT angiography chest was negative for acute pulmonary embolism.  No acute abnormality in the chest. Chest x-ray showed cardiomegaly without evidence of acute chest disease. Aspirin 325 mg x 1 was given, IV NS 1 L was given.  Cardiologist on-call (Dr. Winfred) was consulted and recommended continuous cardiac monitoring, echocardiogram and no indication for any other acute intervention at this time.  Review of Systems: Review of systems as noted in the HPI. All other systems reviewed and are negative.   Past Medical History:  Diagnosis Date   Arthritis    GERD (gastroesophageal reflux disease)    occ   Hyperlipidemia    Hypertension    Sleep apnea    cpap 3 yrs no problems use 1/2 tx   Past Surgical  History:  Procedure Laterality Date   ABDOMINAL HYSTERECTOMY  07/31/10   CHOLECYSTECTOMY  04/24/2012   Procedure: LAPAROSCOPIC CHOLECYSTECTOMY;  Surgeon: Vicenta DELENA Poli, MD;  Location: [facility] OR;  Service: General;  Laterality: N/A;   EYE SURGERY     bil   TONSILLECTOMY     TOTAL KNEE ARTHROPLASTY  01/23/2010/ 02/21/11   bil    Social History:  reports that she has never smoked. She has quit using smokeless tobacco.  Her smokeless tobacco use included snuff. She reports that she does not drink alcohol and does not use drugs.   Allergies  Allergen Reactions   Other Nausea And Vomiting    No problem with shrimp   Oysters [Shellfish Allergy] Nausea And Vomiting    No problem with shrimp    Family History  Problem Relation Age of Onset   Prostate cancer Father      Prior to Admission medications   Medication Sig Start Date End Date Taking? Authorizing Provider  amLODipine  (NORVASC ) 10 MG tablet Take 1 tablet (10 mg total) by mouth daily. 12/13/15   Humberto Elspeth DELENA, MD  amoxicillin  (AMOXIL ) 500 MG tablet Take 2 tablets (1,000 mg total) by mouth 2 (two) times daily. 03/29/16   Claudene Rayfield HERO, MD  B Complex Vitamins (VITAMIN B COMPLEX PO) Vitamin B complex    [provider]  cephALEXin  (KEFLEX ) 500 MG capsule Take 1 capsule (500 mg total) by mouth 2 (two) times daily. 05/07/24   Paduchowski,  Franky, MD  Cholecalciferol (VITAMIN D-1000 MAX ST) 25 MCG (1000 UT) tablet Take by mouth.    [provider]  citalopram (CELEXA) 10 MG tablet Take 1 tablet by mouth daily. 01/08/22 01/08/23  [provider]  Coenzyme Q10 (CO Q 10 PO) Take 1 capsule by mouth daily. Reported on 12/13/2015    [provider]  dexamethasone (DECADRON) 4 MG tablet Take 4 mg by mouth 2 (two) times daily with a meal. Reported on 03/29/2016    [provider]  Inositol Niacinate (NIACIN FLUSH FREE PO) Niacin    [provider]  Lactobacillus (PROBIOTIC ACIDOPHILUS PO) Take 1  capsule by mouth daily.    [provider]  levothyroxine (SYNTHROID) 25 MCG tablet Take by mouth. 09/24/22 12/23/22  [provider]  losartan (COZAAR) 100 MG tablet Take 1 tablet by mouth daily. 08/22/22   [provider]  metoprolol succinate (TOPROL-XL) 25 MG 24 hr tablet Take 1 tablet by mouth daily. 02/06/23 02/06/24  [provider]  niacin (VITAMIN B3) 500 MG tablet Take by mouth.    [provider]  oxybutynin (DITROPAN) 5 MG tablet Take 5 mg by mouth 3 (three) times daily.    [provider]  potassium chloride  SA (KLOR-CON  M20) 20 MEQ tablet Take 1 tablet (20 mEq total) by mouth daily. 12/13/15   Humberto Elspeth LABOR, MD  traMADol  (ULTRAM ) 50 MG tablet Take 1 tablet (50 mg total) by mouth every 6 (six) hours as needed. 05/07/24   Dorothyann Franky, MD  triamterene -hydrochlorothiazide (MAXZIDE-25) 37.5-25 MG tablet Take 1 tablet by mouth daily. 12/13/15   Humberto Elspeth LABOR, MD    Physical Exam: BP (!) 141/63 (BP Location: Left Arm)   Pulse (!) 56   Temp 98.2 F (36.8 C) (Oral)   Resp 18   Ht 5' 7 (1.702 m)   Wt 116.1 kg   SpO2 98%   BMI 40.10 kg/m   General: 88 y.o. year-old female well developed well nourished in no acute distress.  Alert and oriented x3. HEENT: NCAT, EOMI Neck: Supple, trachea medial Cardiovascular: Regular rate and rhythm with no rubs or gallops.  No thyromegaly or JVD noted.  Trace lower extremity edema bilaterally. 2/4 pulses in all 4 extremities. Respiratory: Clear to auscultation with no wheezes or rales. Good inspiratory effort. Abdomen: Soft, nontender nondistended with normal bowel sounds x4 quadrants. Muskuloskeletal: Mild tenderness to palpation of chest.  No cyanosis, clubbing noted bilaterally Neuro: CN II-XII intact, strength 5/5 x 4, sensation, reflexes intact Skin: No ulcerative lesions noted or rashes Psychiatry: Judgement and insight appear normal. Mood is appropriate for condition and setting           Labs on Admission:  Basic Metabolic Panel: Recent Labs  Lab 05/16/24 2304  NA 138  K 4.3  CL 104  CO2 21*  GLUCOSE 169*  BUN 27*  CREATININE 1.55*  CALCIUM 9.7   Liver Function Tests: Recent Labs  Lab 05/17/24 0033  AST 21  ALT 15  ALKPHOS 53  BILITOT 1.2  PROT 6.7  ALBUMIN 3.7   Recent Labs  Lab 05/17/24 0033  LIPASE 37   No results for input(s): AMMONIA in the last 168 hours. CBC: Recent Labs  Lab 05/16/24 2304  WBC 9.8  HGB 12.6  HCT 38.4  MCV 95.5  PLT 171   Cardiac Enzymes: No results for input(s): CKTOTAL, CKMB, CKMBINDEX, TROPONINI in the last 168 hours.  BNP (last 3 results) No results for input(s):  BNP in the last 8760 hours.  ProBNP (last 3 results) No results for input(s): PROBNP in the last 8760 hours.  CBG: No results for input(s): GLUCAP in the last 168 hours.  Radiological Exams on Admission: CT Angio Chest PE W and/or Wo Contrast Result Date: 05/17/2024 CLINICAL DATA:  Chest pain and vomiting. PE suspected. The pain radiates to the back. EXAM: CT ANGIOGRAPHY CHEST WITH CONTRAST TECHNIQUE: Multidetector CT imaging of the chest was performed using the standard protocol during bolus administration of intravenous contrast. Multiplanar CT image reconstructions and MIPs were obtained to evaluate the vascular anatomy. RADIATION DOSE REDUCTION: This exam was performed according to the departmental dose-optimization program which includes automated exposure control, adjustment of the mA and/or kV according to patient size and/or use of iterative reconstruction technique. CONTRAST:  60mL OMNIPAQUE  IOHEXOL  350 MG/ML SOLN COMPARISON:  05/16/2024 radiograph FINDINGS: Cardiovascular: Negative for acute pulmonary embolism. Normal caliber thoracic aorta. No pericardial effusion. Coronary artery and aortic atherosclerotic calcification. Mediastinum/Nodes: Trachea and esophagus are unremarkable. No thoracic adenopathy Lungs/Pleura: Expiratory  phase exam. Bibasilar atelectasis/scarring. Otherwise no focal consolidation, pleural effusion, or pneumothorax. Upper Abdomen: No acute abnormality. Musculoskeletal: No acute fracture. Review of the MIP images confirms the above findings. IMPRESSION: Negative for acute pulmonary embolism. No acute abnormality in the chest. Electronically Signed   By: Norman Gatlin M.D.   On: 05/17/2024 03:00   DG Chest 2 View Result Date: 05/16/2024 CLINICAL DATA:  Chest pains and vomiting onset 3 p.m. today with diaphoresis. EXAM: CHEST - 2 VIEW COMPARISON:  Chest PA Lat 04/22/2012 FINDINGS: The heart has moderately enlarged since the prior study. No vascular congestion is seen or evidence of edema or pleural collection. The lungs are clear with mild elevation of the right hemidiaphragm. The mediastinum is normally outlined. There is moderate aortic atherosclerosis. Degenerative change thoracic spine. IMPRESSION: Cardiomegaly without evidence of acute chest disease. Aortic atherosclerosis. Electronically Signed   By: Francis Quam M.D.   On: 05/16/2024 23:41    EKG: I independently viewed the EKG done and my findings are as followed: Sinus bradycardia with APCs, repolarization abnormality in the inferolateral leads similar to EKG done on/5/25  Assessment/Plan Present on Admission:  Atypical chest pain  Principal Problem:   Atypical chest pain Active Problems:   Nausea & vomiting   AKI (acute kidney injury) (HCC)   Obesity, Class III, BMI 40-49.9 (morbid obesity)   Essential hypertension   Mixed hyperlipidemia   OSA on CPAP  Atypical Chest Pain Elevated troponin Continue telemetry  Troponins x2 - 15 > 60, continue to trend troponin Cardiology will be consulted to help decide if Stress test is needed in am Versus other  diagnostic modalities.    Aspirin 324 mg x 1 was given  Nausea and vomiting Continue Zofran  as needed  Acute kidney injury Creatinine 1.55 (this was 1.2 about 10 days ago) Renally  adjust medications, avoid nephrotoxic agents/dehydration/hypotension  Obesity class III (BMI of 40.1) Diet and lifestyle modification  Essential hypertension Continue amlodipine , losartan  Mixed hyperlipidemia Continue Niacin  OSA on CPAP Continue CPAP  DVT prophylaxis: Lovenox  Code Status: Full code  Family Communication: Husband and son at bedside (all questions answered to satisfaction)  Consults: Cardiology  Severity of Illness: The appropriate patient status for this patient is OBSERVATION. Observation status is judged to be reasonable and necessary in order to provide the required intensity of service to ensure the patient's safety. The patient's presenting symptoms, physical exam findings, and initial radiographic and  laboratory data in the context of their medical condition is felt to place them at decreased risk for further clinical deterioration. Furthermore, it is anticipated that the patient will be medically stable for discharge from the hospital within 2 midnights of admission.   Author: Paysley Poplar, DO 05/17/2024 6:28 AM  For on call review [email or web address].

## 2024-05-17 NOTE — Discharge Summary (Signed)
 [name], is a 88 y.o. female  DOB 1936/04/20  MRN [id number].  Admission date:  05/16/2024  Admitting Physician  Posey Maier, DO  Discharge Date:  05/17/2024   Primary MD  Sadie Manna, MD  Recommendations for primary care physician for things to follow:  1)Avoid ibuprofen/Advil/Aleve/Motrin/Goody Powders/Naproxen/BC powders/Meloxicam/Diclofenac/Indomethacin and other Nonsteroidal anti-inflammatory medications as these will make you more likely to bleed and can cause stomach ulcers, can also cause Kidney problems.   2)Avoid Dehydration  3)follow up with Your Cardiologist [facility] as previously scheduled-   Admission Diagnosis  Precordial pain [R07.2] Dehydration [E86.0] Chest pain [R07.9]  Discharge Diagnosis  Precordial pain [R07.2] Dehydration [E86.0] Chest pain [R07.9]    Principal Problem:   Atypical chest pain Active Problems:   Nausea & vomiting   AKI (acute kidney injury) (HCC)   Obesity, Class III, BMI 40-49.9 (morbid obesity)   Essential hypertension   Mixed hyperlipidemia   OSA on CPAP      Past Medical History:  Diagnosis Date   Arthritis    GERD (gastroesophageal reflux disease)    occ   Hyperlipidemia    Hypertension    Sleep apnea    cpap 3 yrs no problems use 1/2 tx    Past Surgical History:  Procedure Laterality Date   ABDOMINAL HYSTERECTOMY  07/31/10   CHOLECYSTECTOMY  04/24/2012   Procedure: LAPAROSCOPIC CHOLECYSTECTOMY;  Surgeon: Vicenta DELENA Poli, MD;  Location: [facility] OR;  Service: General;  Laterality: N/A;   EYE SURGERY     bil   TONSILLECTOMY     TOTAL KNEE ARTHROPLASTY  01/23/2010/ 02/21/11   bil     HPI  from the history and physical done on the day of admission:    HPI: [name] is a 88 y.o. female with medical history significant of hypertension, OSA on CPAP who presents to the emergency department due to chest pain.  She complained of  left-sided chest pain which started around 3 PM yesterday with radiation to her shoulder blade.  Pain is reproducible component and was rated as 5-6/10 on pain scale, it was associated with nausea and vomiting.  Son at bedside states that patient has not been using the air conditioner during excessive heat and has been sweaty at times.  Patient endorsed lightheadedness upon standing without passing out and also reports mild headache.   ED Course:  In the emergency department, BP was 148/62, other vital signs are within normal range.  Workup in the ED showed normal CBC and BMP except for CO2 of 21, blood glucose 169, BUN 27, creatinine 1.55 (this was 1.2 about 10 days ago).  Troponin 15 > 60, lipase 37, D-dimer 1.18. CT angiography chest was negative for acute pulmonary embolism.  No acute abnormality in the chest. Chest x-ray showed cardiomegaly without evidence of acute chest disease. Aspirin 325 mg x 1 was given, IV NS 1 L was given.  Cardiologist on-call (Dr. Winfred) was consulted and recommended continuous cardiac monitoring, echocardiogram and no indication for any other acute intervention  at this time.   Review of Systems: Review of systems as noted in the HPI. All other systems reviewed and are negative    Hospital Course:     1)Atypical Chest Pain--in the setting of nausea and vomiting episodes -- Ruled out for ACS by cardiac enzymes and EKG - Cardiology consult appreciated - Patient remains chest pain-free since resolution of episodes of nausea and vomiting - Outpatient follow-up with [facility] cardiology advised  2)Gastritis--- patient ate chicken sandwich at [employer]--- 3 hours later had episodes of nausea and vomiting and dizziness - GI symptoms have since resolved - Avoid dehydration  3)Acute kidney injury on CKD 3B Creatinine 1.55 (this was 1.17 on 05/07/24) - Creatinine back to baseline at this time 1.24 Renally adjust medications, avoid nephrotoxic  agents/dehydration/hypotension   4)Morbid Obesity- -Low calorie diet, portion control and increase physical activity discussed with patient -Body mass index is 40.05 kg/m.  5)HTN--stable, resume PTA BP medications including amlodipine , losartan and metoprolol   6)Mixed hyperlipidemia Continue Niacin   7)OSA on CPAP Continue CPAP  8)Mild Anemia--hemoglobin currently 11.8, it was 12.6 on admission suspect some component of hemodilution from fluids - No bleeding concerns  Discharge Condition: stable   Follow UP--outpatient follow-up with [facility] cardiology  Consults obtained - Cardiology  Diet and Activity recommendation:  As advised  Discharge Instructions    Discharge Instructions     Call MD for:  difficulty breathing, headache or visual disturbances   Complete by: As directed    Call MD for:  persistant dizziness or light-headedness   Complete by: As directed    Call MD for:  persistant nausea and vomiting   Complete by: As directed    Call MD for:  temperature >100.4   Complete by: As directed    Diet - low sodium heart healthy   Complete by: As directed    Discharge instructions   Complete by: As directed    1)Avoid ibuprofen/Advil/Aleve/Motrin/Goody Powders/Naproxen/BC powders/Meloxicam/Diclofenac/Indomethacin and other Nonsteroidal anti-inflammatory medications as these will make you more likely to bleed and can cause stomach ulcers, can also cause Kidney problems.   2)Avoid Dehydration  3)follow up with Your Cardiologist [facility] as previously scheduled-   Increase activity slowly   Complete by: As directed        Discharge Medications     Allergies as of 05/17/2024       Reactions   Other Nausea And Vomiting   No problem with shrimp   Oysters [shellfish Allergy] Nausea And Vomiting   No problem with shrimp        Medication List     STOP taking these medications    amoxicillin  500 MG tablet Commonly known as: AMOXIL    cephALEXin  500 MG  capsule Commonly known as: KEFLEX    citalopram 10 MG tablet Commonly known as: CELEXA   potassium chloride  SA 20 MEQ tablet Commonly known as: Klor-Con  M20       TAKE these medications    acetaminophen  325 MG tablet Commonly known as: TYLENOL  Take 2 tablets (650 mg total) by mouth every 6 (six) hours as needed for mild pain (pain score 1-3) or fever (or Fever >/= 101).   amLODipine  10 MG tablet Commonly known as: NORVASC  Take 1 tablet (10 mg total) by mouth daily.   escitalopram 10 MG tablet Commonly known as: LEXAPRO Take 10 mg by mouth daily.   levothyroxine 25 MCG tablet Commonly known as: SYNTHROID Take 25 mcg by mouth daily before breakfast.   losartan 100  MG tablet Commonly known as: COZAAR Take 1 tablet by mouth daily.   metoprolol succinate 25 MG 24 hr tablet Commonly known as: TOPROL-XL Take 1 tablet by mouth daily.   multivitamin with minerals Tabs tablet Take 1 tablet by mouth daily.   niacin 500 MG tablet Commonly known as: VITAMIN B3 Take by mouth.   traMADol  50 MG tablet Commonly known as: Ultram  Take 1 tablet (50 mg total) by mouth every 6 (six) hours as needed.   VITAMIN B COMPLEX PO Vitamin B complex   Vitamin D-1000 Max St 25 MCG (1000 UT) tablet Generic drug: Cholecalciferol Take by mouth.        Major procedures and Radiology Reports - PLEASE review detailed and final reports for all details, in brief -   ECHOCARDIOGRAM COMPLETE Result Date: 05/17/2024    ECHOCARDIOGRAM REPORT   Patient Name:   [name] Date of Exam: 05/17/2024 Medical Rec #:  [id number]          Height:       67.0 in Accession #:    [id number]         Weight:       255.7 lb Date of Birth:  09-Jan-1936         BSA:          2.245 m Patient Age:    87 years           BP:           140/55 mmHg Patient Gender: F                  HR:           60 bpm. Exam Location:  [facility] Procedure: 2D Echo, 3D Echo, Cardiac Doppler, Color Doppler and Strain Analysis             (Both Spectral and Color Flow Doppler were utilized during            procedure). Indications:    chest pain  History:        Patient has prior history of Echocardiogram examinations and                 Patient has no prior history of Echocardiogram examinations.                 Risk Factors:Hypertension, Sleep Apnea and Dyslipidemia.  Sonographer:    Therisa Crouch Referring Phys: [id number] Gissele Narducci IMPRESSIONS  1. Left ventricular ejection fraction, by estimation, is 65 to 70%. The left ventricle has normal function. The left ventricle has no regional wall motion abnormalities. There is mild left ventricular hypertrophy. Left ventricular diastolic parameters are indeterminate. The average left ventricular global longitudinal strain is -23.1 %. The global longitudinal strain is normal.  2. Right ventricular systolic function is normal. The right ventricular size is normal.  3. Left atrial size was mildly dilated.  4. The mitral valve is normal in structure. Trivial mitral valve regurgitation. No evidence of mitral stenosis.  5. The tricuspid valve is abnormal.  6. The aortic valve is tricuspid. There is mild calcification of the aortic valve. There is mild thickening of the aortic valve. Aortic valve regurgitation is mild. No aortic stenosis is present.  7. The inferior vena cava is normal in size with greater than 50% respiratory variability, suggesting right atrial pressure of 3 mmHg. FINDINGS  Left Ventricle: Left ventricular ejection fraction, by estimation, is 65 to 70%. The left ventricle has  normal function. The left ventricle has no regional wall motion abnormalities. The average left ventricular global longitudinal strain is -23.1 %. Strain was performed and the global longitudinal strain is normal. The left ventricular internal cavity size was normal in size. There is mild left ventricular hypertrophy. Left ventricular diastolic parameters are indeterminate. Right Ventricle: The right ventricular  size is normal. Right vetricular wall thickness was not well visualized. Right ventricular systolic function is normal. Left Atrium: Left atrial size was mildly dilated. Right Atrium: Right atrial size was normal in size. Pericardium: There is no evidence of pericardial effusion. Mitral Valve: The mitral valve is normal in structure. Trivial mitral valve regurgitation. No evidence of mitral valve stenosis. Tricuspid Valve: The tricuspid valve is abnormal. Tricuspid valve regurgitation is mild . No evidence of tricuspid stenosis. Aortic Valve: The aortic valve is tricuspid. There is mild calcification of the aortic valve. There is mild thickening of the aortic valve. Aortic valve regurgitation is mild. No aortic stenosis is present. Aortic valve mean gradient measures 7.0 mmHg. Aortic valve peak gradient measures 13.0 mmHg. Aortic valve area, by VTI measures 2.78 cm. Pulmonic Valve: The pulmonic valve was not well visualized. Pulmonic valve regurgitation is not visualized. No evidence of pulmonic stenosis. Aorta: The aortic root and ascending aorta are structurally normal, with no evidence of dilitation. Venous: The inferior vena cava is normal in size with greater than 50% respiratory variability, suggesting right atrial pressure of 3 mmHg. IAS/Shunts: No atrial level shunt detected by color flow Doppler. Additional Comments: 3D was performed not requiring image post processing on an independent workstation and was normal.  LEFT VENTRICLE PLAX 2D LVIDd:         5.20 cm   Diastology LVIDs:         3.00 cm   LV e' medial:    4.68 cm/s LV PW:         1.10 cm   LV E/e' medial:  14.2 LV IVS:        1.10 cm   LV e' lateral:   10.60 cm/s LVOT diam:     2.10 cm   LV E/e' lateral: 6.3 LV SV:         118 LV SV Index:   52        2D Longitudinal Strain LVOT Area:     3.46 cm  2D Strain GLS Avg:     -23.1 %                           3D Volume EF                          LV 3D EDV:   66.77 ml                          LV 3D  ESV:   20.66 ml RIGHT VENTRICLE             IVC RV Basal diam:  4.10 cm     IVC diam: 1.70 cm RV S prime:     16.40 cm/s TAPSE (M-mode): 3.0 cm LEFT ATRIUM             Index        RIGHT ATRIUM           Index LA diam:        4.40 cm 1.96 cm/m  RA Area:     24.80 cm LA Vol (A2C):   76.6 ml 34.10 ml/m  RA Volume:   72.10 ml  32.12 ml/m LA Vol (A4C):   87.0 ml 38.75 ml/m LA Biplane Vol: 88.0 ml 39.20 ml/m  AORTIC VALVE AV Area (Vmax):    2.79 cm AV Area (Vmean):   2.78 cm AV Area (VTI):     2.78 cm AV Vmax:           180.00 cm/s AV Vmean:          122.000 cm/s AV VTI:            0.423 m AV Peak Grad:      13.0 mmHg AV Mean Grad:      7.0 mmHg LVOT Vmax:         145.00 cm/s LVOT Vmean:        97.900 cm/s LVOT VTI:          0.340 m LVOT/AV VTI ratio: 0.80  AORTA Ao Root diam: 3.40 cm Ao Asc diam:  3.50 cm MITRAL VALVE               TRICUSPID VALVE MV Area (PHT): 4.15 cm    TR Peak grad:   38.4 mmHg MV Decel Time: 183 msec    TR Vmax:        310.00 cm/s MV E velocity: 66.60 cm/s MV A velocity: 67.60 cm/s  SHUNTS MV E/A ratio:  0.99        Systemic VTI:  0.34 m                            Systemic Diam: 2.10 cm Dorn Ross MD Electronically signed by Dorn Ross MD Signature Date/Time: 05/17/2024/12:16:11 PM    Final    CT Angio Chest PE W and/or Wo Contrast Result Date: 05/17/2024 CLINICAL DATA:  Chest pain and vomiting. PE suspected. The pain radiates to the back. EXAM: CT ANGIOGRAPHY CHEST WITH CONTRAST TECHNIQUE: Multidetector CT imaging of the chest was performed using the standard protocol during bolus administration of intravenous contrast. Multiplanar CT image reconstructions and MIPs were obtained to evaluate the vascular anatomy. RADIATION DOSE REDUCTION: This exam was performed according to the departmental dose-optimization program which includes automated exposure control, adjustment of the mA and/or kV according to patient size and/or use of iterative reconstruction technique. CONTRAST:   60mL OMNIPAQUE  IOHEXOL  350 MG/ML SOLN COMPARISON:  05/16/2024 radiograph FINDINGS: Cardiovascular: Negative for acute pulmonary embolism. Normal caliber thoracic aorta. No pericardial effusion. Coronary artery and aortic atherosclerotic calcification. Mediastinum/Nodes: Trachea and esophagus are unremarkable. No thoracic adenopathy Lungs/Pleura: Expiratory phase exam. Bibasilar atelectasis/scarring. Otherwise no focal consolidation, pleural effusion, or pneumothorax. Upper Abdomen: No acute abnormality. Musculoskeletal: No acute fracture. Review of the MIP images confirms the above findings. IMPRESSION: Negative for acute pulmonary embolism. No acute abnormality in the chest. Electronically Signed   By: Norman Gatlin M.D.   On: 05/17/2024 03:00   DG Chest 2 View Result Date: 05/16/2024 CLINICAL DATA:  Chest pains and vomiting onset 3 p.m. today with diaphoresis. EXAM: CHEST - 2 VIEW COMPARISON:  Chest PA Lat 04/22/2012 FINDINGS: The heart has moderately enlarged since the prior study. No vascular congestion is seen or evidence of edema or pleural collection. The lungs are clear with mild elevation of the right hemidiaphragm. The mediastinum is normally outlined. There is moderate aortic atherosclerosis. Degenerative change thoracic spine. IMPRESSION: Cardiomegaly without evidence of  acute chest disease. Aortic atherosclerosis. Electronically Signed   By: Francis Quam M.D.   On: 05/16/2024 23:41   CT ABDOMEN PELVIS W CONTRAST Result Date: 05/07/2024 CLINICAL DATA:  RIGHT lower quadrant abdominal pain. EXAM: CT ABDOMEN AND PELVIS WITH CONTRAST TECHNIQUE: Multidetector CT imaging of the abdomen and pelvis was performed using the standard protocol following bolus administration of intravenous contrast. RADIATION DOSE REDUCTION: This exam was performed according to the departmental dose-optimization program which includes automated exposure control, adjustment of the mA and/or kV according to patient size  and/or use of iterative reconstruction technique. CONTRAST:  OMNIPAQUE  IOHEXOL  300 MG/ML  SOLN COMPARISON:  None Available. FINDINGS: Lower chest: Lung bases are clear. Hepatobiliary: Postcholecystectomy. Mild biliary dilatation following cholecystectomy. Several low-density hepatic cysts. Pancreas: Pancreas is normal. No ductal dilatation. No pancreatic inflammation. Spleen: Normal spleen Adrenals/urinary tract: Adrenal glands normal. Bilateral multiple simple fluid attenuation renal cyst on postcontrast imaging. No complexity other than some thin curvilinear calcifications. No enhancing nodularity. Ureters and bladder normal. Stomach/Bowel: Small hiatal hernia. Stomach, duodenum small-bowel normal. Normal appendix. Multiple diverticula of the descending colon and sigmoid colon without acute inflammation. Vascular/Lymphatic: Abdominal aorta is normal caliber with atherosclerotic calcification. There is no retroperitoneal or periportal lymphadenopathy. No pelvic lymphadenopathy. Reproductive: Post hysterectomy.  Adnexa unremarkable Other: No free fluid. Musculoskeletal: No aggressive osseous lesion. IMPRESSION: 1. No acute findings in the abdomen pelvis. 2. Normal appendix. 3. Diverticulosis without diverticulitis. 4. Mild biliary duct dilatation following cholecystectomy. Favor physiologic postcholecystectomy change. Recommend correlation with bilirubin. 5. Multiple bilateral renal cysts.Bosniak I benign renal cysts. No follow-up imaging is recommended. JACR 2018 Feb; 264-273, Management of the Incidental Renal Mass on CT, [employer] 2021; 814-848, Bosniak Classification of Cystic Renal Masses, Version 2019. 6.  Aortic Atherosclerosis (ICD10-I70.0). Electronically Signed   By: Jackquline Boxer M.D.   On: 05/07/2024 17:58   US  Carotid Bilateral Result Date: 04/28/2024 CLINICAL DATA:  Right carotid bruit EXAM: BILATERAL CAROTID DUPLEX ULTRASOUND TECHNIQUE: Elnor scale imaging, color Doppler and duplex  ultrasound were performed of bilateral carotid and vertebral arteries in the neck. COMPARISON:  None Available. FINDINGS: Criteria: Quantification of carotid stenosis is based on velocity parameters that correlate the residual internal carotid diameter with NASCET-based stenosis levels, using the diameter of the distal internal carotid lumen as the denominator for stenosis measurement. The following velocity measurements were obtained: RIGHT ICA: 111 cm/sec CCA: 82 cm/sec SYSTOLIC ICA/CCA RATIO:  1.4 ECA: 119 cm/sec LEFT ICA: 85 cm/sec CCA: 70 cm/sec SYSTOLIC ICA/CCA RATIO:  1.2 ECA: 94 cm/sec RIGHT CAROTID ARTERY: Mild atherosclerotic changes, patent. RIGHT VERTEBRAL ARTERY:  Antegrade LEFT CAROTID ARTERY:  Mild atherosclerotic changes, patent. LEFT VERTEBRAL ARTERY:  Antegrade IMPRESSION: 1. Right carotid system: Mild atherosclerotic changes in the proximal right internal carotid artery with estimated stenosis measuring less than 50%. 2. Left carotid system: Mild atherosclerotic changes in the proximal left internal carotid artery of estimated stenosis measuring less than 50%. Electronically Signed   By: Maude Naegeli M.D.   On: 04/28/2024 12:05    Micro Results   Recent Results (from the past 240 hours)  Urine Culture     Status: Abnormal   Collection Time: 05/07/24  2:43 PM   Specimen: Urine, Clean Catch  Result Value Ref Range Status   Specimen Description   Final    URINE, CLEAN CATCH Performed at [facility], [address], [place], [place] [address]    Special Requests   Final    NONE Performed at [facility], [address] [place], [place] [address]    Culture (A)  Final    <10,000 COLONIES/mL INSIGNIFICANT GROWTH Performed at [facility], [address] [place], [place] [address]    Report Status 05/08/2024 FINAL  Final   Today   Subjective    [name] today has no new complaints - No further nausea or vomiting - No diarrhea No fever   Or chills  Patient's daughter-in-law Lolita , questions answered        Patient has been seen and examined prior to discharge   Objective   Blood pressure (!) 140/55, pulse (!) 58, temperature 97.8 F (36.6 C), temperature source Oral, resp. rate 18, height 5' 7 (1.702 m), weight 116 kg, SpO2 96%.  Exam Gen:- Awake Alert, no acute distress  HEENT:- [place].AT, No sclera icterus Neck-Supple Neck,No JVD,.  Lungs-  CTAB , good air movement bilaterally CV- S1, S2 normal, regular Abd-  +ve B.Sounds, Abd Soft, No tenderness,    Extremity/Skin:- No  edema,   good pulses Psych-affect is appropriate, oriented x3 Neuro-no new focal deficits, no tremors    Data Review   CBC w Diff:  Lab Results  Component Value Date   WBC 8.2 05/17/2024   HGB 11.8 (L) 05/17/2024   HCT 35.8 (L) 05/17/2024   PLT 152 05/17/2024   LYMPHOPCT 18 07/27/2010   MONOPCT 7 07/27/2010   EOSPCT 0 07/27/2010   BASOPCT 0 07/27/2010    CMP:  Lab Results  Component Value Date   NA 138 05/17/2024   K 4.2 05/17/2024   CL 108 05/17/2024   CO2 20 (L) 05/17/2024   BUN 22 05/17/2024   CREATININE 1.24 (H) 05/17/2024   CREATININE 1.21 (H) 12/18/2015   PROT 6.1 (L) 05/17/2024   ALBUMIN 3.2 (L) 05/17/2024   BILITOT 0.9 05/17/2024   ALKPHOS 50 05/17/2024   AST 18 05/17/2024   ALT 13 05/17/2024   Total Discharge time is about 33 minutes  Rendall Carwin M.D on 05/17/2024 at 1:07 PM  Go to [email or web address] -  for contact info  [facility] - Office  [phone]

## 2024-05-17 NOTE — Consult Note (Addendum)
 Cardiology Consultation   Patient ID: [name] MRN: [id number]; DOB: 1936-02-28  Admit date: 05/16/2024 Date of Consult: 05/17/2024  PCP:  Sadie Manna, MD   [facility] Providers Cardiologist:  None   [facility] by Dr. Ammon.    Patient Profile: [name] is a 88 y.o. female with a hx of HTN, intermittent palpitations, bradycardia, PACs, exertional dyspnea, peripheral edema, OSA on CPA, CKD 3A, obesity who is being seen 05/17/2024 for the evaluation of chest pain  at the request of Dr. Pearlean.  History of Present Illness: [name] has not been seen by heart care.  Per care everywhere, she is followed by [facility] with [facility] by Dr. Ammon.  Lexiscan  06/2016 showed normal LV function, normal wall motion, and no evidence of scar or ischemia.  72-hour Holter monitor 02/2023 showed predominantly sinus bradycardia with mean HR of 50 bpm (HR rang 37-85 bpm), frequent PACs at 4%.  Echo 03/28/2023 showed EF > 55% with normal LV function, mild LVH, mild MVR and moderate TVR Patient was last seen by [facility] in [place] 04/20/2024 for 47-month follow-up.  At that time, denied chest pain but reported reported chronic exertional dyspnea, occasional palpitations and chronic peripheral edema.  No med changes or additional testing.  Continued on amlodipine  10 mg daily, Toprol XL 25 mg daily, niacin 500 mg daily, olmesartan 20 mg daily, and Klor-Con  20 MEQ daily. Discussed lifestyle modification.    Patient presented to [facility] ED on 6/29 for chest pain.  K 4.3, CR 1.55 now 1.24, TN 16 > 60> 51, Hgb 11.8, D-dimer 1.18.   EKG showed sinus bradycardia, HR 50-->56, PAC, chronic mild ST depression/inverted T waves (inferior leads, V4-V6) CXR showed cardiomegaly and aortic atherosclerosis W/O evidence of acute findings. CTA negative for PE. ED treated with ASA 325 mg, IV NS. Cardiologist on-call (Dr. Winfred) was consulted and recommended continuous cardiac  monitoring, echocardiogram and no indication for any other acute intervention at this time.   On interview, patient reported chest pain X started yesterday after getting out of bed, not able to describe, located on left side of chest, radiates through to shoulder blade, severity 7/10, lasting 15 min, no exacerbating or alleviating factors. Currently CP 1/10.  Associated symptoms include N/V, diaphoresis, dizziness, cough. Notes prior episodes of CP over a year ago but states this was worst. Denies SOB, syncope, orthopnea, PND, claudication. No recent trauma or fever. She is able to go to grocery hopping using  grocery cart for stability but takes breaks due to SOB but NO CP. Reports unchanged chronic DOE x 3-4 year. Also reports unchanged intermittent palpitations that occur at night, Also reports chronic unchanged edema since 88 year old that is relieved with compression stockings and leg elevation. Reports medication compliance.  Denies any tobacco use, EtOH, or drugs.  Family history significant for father (MI at 26), HTN (paternal and maternal side).    Past Medical History:  Diagnosis Date   Arthritis    GERD (gastroesophageal reflux disease)    occ   Hyperlipidemia    Hypertension    Sleep apnea    cpap 3 yrs no problems use 1/2 tx    Past Surgical History:  Procedure Laterality Date   ABDOMINAL HYSTERECTOMY  07/31/10   CHOLECYSTECTOMY  04/24/2012   Procedure: LAPAROSCOPIC CHOLECYSTECTOMY;  Surgeon: Vicenta DELENA Poli, MD;  Location: [facility] OR;  Service: General;  Laterality: N/A;   EYE SURGERY     bil  TONSILLECTOMY     TOTAL KNEE ARTHROPLASTY  01/23/2010/ 02/21/11   bil     Home Medications:  Prior to Admission medications   Medication Sig Start Date End Date Taking? Authorizing Provider  amLODipine  (NORVASC ) 10 MG tablet Take 1 tablet (10 mg total) by mouth daily. 12/13/15   Humberto Elspeth LABOR, MD  amoxicillin  (AMOXIL ) 500 MG tablet Take 2 tablets (1,000 mg total) by mouth 2 (two) times  daily. 03/29/16   Claudene Rayfield HERO, MD  B Complex Vitamins (VITAMIN B COMPLEX PO) Vitamin B complex    [provider]  cephALEXin  (KEFLEX ) 500 MG capsule Take 1 capsule (500 mg total) by mouth 2 (two) times daily. 05/07/24   Dorothyann Drivers, MD  Cholecalciferol (VITAMIN D-1000 MAX ST) 25 MCG (1000 UT) tablet Take by mouth.    [provider]  citalopram (CELEXA) 10 MG tablet Take 1 tablet by mouth daily. 01/08/22 01/08/23  [provider]  Coenzyme Q10 (CO Q 10 PO) Take 1 capsule by mouth daily. Reported on 12/13/2015    [provider]  dexamethasone (DECADRON) 4 MG tablet Take 4 mg by mouth 2 (two) times daily with a meal. Reported on 03/29/2016    [provider]  Inositol Niacinate (NIACIN FLUSH FREE PO) Niacin    [provider]  Lactobacillus (PROBIOTIC ACIDOPHILUS PO) Take 1 capsule by mouth daily.    [provider]  levothyroxine (SYNTHROID) 25 MCG tablet Take by mouth. 09/24/22 12/23/22  [provider]  losartan (COZAAR) 100 MG tablet Take 1 tablet by mouth daily. 08/22/22   [provider]  metoprolol succinate (TOPROL-XL) 25 MG 24 hr tablet Take 1 tablet by mouth daily. 02/06/23 02/06/24  [provider]  niacin (VITAMIN B3) 500 MG tablet Take by mouth.    [provider]  oxybutynin (DITROPAN) 5 MG tablet Take 5 mg by mouth 3 (three) times daily.    [provider]  potassium chloride  SA (KLOR-CON  M20) 20 MEQ tablet Take 1 tablet (20 mEq total) by mouth daily. 12/13/15   Humberto Elspeth LABOR, MD  traMADol  (ULTRAM ) 50 MG tablet Take 1 tablet (50 mg total) by mouth every 6 (six) hours as needed. 05/07/24   Dorothyann Drivers, MD  triamterene -hydrochlorothiazide (MAXZIDE-25) 37.5-25 MG tablet Take 1 tablet by mouth daily. 12/13/15   Humberto Elspeth LABOR, MD    Scheduled Meds:  amLODipine   10 mg Oral Daily   enoxaparin (LOVENOX) injection  40 mg Subcutaneous Q24H   losartan  100 mg Oral Daily   niacin   500 mg Oral QHS   Continuous Infusions:  PRN Meds: acetaminophen  **OR** acetaminophen , ondansetron  **OR** ondansetron  (ZOFRAN ) IV  Allergies:    Allergies  Allergen Reactions   Other Nausea And Vomiting    No problem with shrimp   Oysters [Shellfish Allergy] Nausea And Vomiting    No problem with shrimp    Social History:   Social History   Socioeconomic History   Marital status: Married    Spouse name: Not on file   Number of children: Not on file   Years of education: Not on file   Highest education level: Not on file  Occupational History   Not on file  Tobacco Use   Smoking status: Never   Smokeless tobacco: Former    Types: Snuff  Vaping Use   Vaping status: Never Used  Substance and Sexual Activity   Alcohol use: No   Drug use: No   Sexual activity: Yes  Birth control/protection: Post-menopausal  Other Topics Concern   Not on file  Social History Narrative   Not on file   Social Drivers of Health   Financial Resource Strain: Low Risk  (04/14/2024)   Received from [facility]   Overall Financial Resource Strain (CARDIA)    Difficulty of Paying Living Expenses: Not hard at all  Food Insecurity: No Food Insecurity (05/17/2024)   Hunger Vital Sign    Worried About Running Out of Food in the Last Year: Never true    Ran Out of Food in the Last Year: Never true  Transportation Needs: No Transportation Needs (05/17/2024)   PRAPARE - Administrator, Civil Service (Medical): No    Lack of Transportation (Non-Medical): No  Physical Activity: Not on file  Stress: Not on file  Social Connections: Moderately Integrated (05/17/2024)   Social Connection and Isolation Panel    Frequency of Communication with Friends and Family: More than three times a week    Frequency of Social Gatherings with Friends and Family: More than three times a week    Attends Religious Services: More than 4 times per year    Active Member of [employer] or  Organizations: No    Attends Banker Meetings: Never    Marital Status: Married  Catering manager Violence: Not At Risk (05/17/2024)   Humiliation, Afraid, Rape, and Kick questionnaire    Fear of Current or Ex-Partner: No    Emotionally Abused: No    Physically Abused: No    Sexually Abused: No    Family History:   Family History  Problem Relation Age of Onset   Prostate cancer Father      ROS:  Please see the history of present illness.  All other ROS reviewed and negative.     Physical Exam/Data: Vitals:   05/17/24 0425 05/17/24 0500 05/17/24 0527 05/17/24 0530  BP: (!) 143/58 (!) 135/56  (!) 141/63  Pulse: (!) 55 (!) 51  (!) 56  Resp: 18 18  18   Temp:    98.2 F (36.8 C)  TempSrc:    Oral  SpO2: 97% 99%  98%  Weight:   116 kg   Height:   5' 7 (1.702 m)    No intake or output data in the 24 hours ending 05/17/24 0728    05/17/2024    5:27 AM 05/16/2024   10:44 PM 05/07/2024    1:43 PM  Last 3 Weights  Weight (lbs) 255 lb 11.7 oz 256 lb 257 lb  Weight (kg) 116 kg 116.121 kg 116.574 kg     Body mass index is 40.05 kg/m.  General:  Well nourished, well developed, in no acute distress; Accompanied by daughter in law.  HEENT: normal Neck: no JVD Vascular: No carotid bruits; Distal pulses 2+ bilaterally Cardiac:  normal S1, S2; RRR; no murmur  Lungs:  clear to auscultation bilaterally, no wheezing, rhonchi or rales  Abd: soft, nontender, no hepatomegaly  Ext: trace edema Musculoskeletal:  No deformities, BUE and BLE strength normal and equal Skin: warm and dry  Neuro:  CNs 2-12 intact, no focal abnormalities noted Psych:  Normal affect   EKG:  The EKG was personally reviewed and demonstrates:  sinus bradycardia, HR 50-->56, PAC, chronic mild ST depression/inverted T waves (inferior leads, V4-V6) Telemetry:  Telemetry was personally reviewed and demonstrates:  Sinus bradycardia, HR 50's with PAC   Relevant CV Studies: [facility] by Dr.  Ammon.  Lexiscan  06/2016 showed normal LV function, normal wall motion, and no evidence of scar or ischemia.  72-hour Holter monitor 02/2023 showed predominantly sinus bradycardia with mean HR of 50 bpm (HR rang 37-85 bpm), frequent PACs at 4%.  Echo 03/28/2023 showed EF > 55% with normal LV function, mild LVH, mild MVR and moderate TVR  Laboratory Data: High Sensitivity Troponin:   Recent Labs  Lab 05/16/24 2304 05/17/24 0033 05/17/24 0629  TROPONINIHS 15 60* 51*     Chemistry Recent Labs  Lab 05/16/24 2304 05/17/24 0629  NA 138 138  K 4.3 4.2  CL 104 108  CO2 21* 20*  GLUCOSE 169* 121*  BUN 27* 22  CREATININE 1.55* 1.24*  CALCIUM 9.7 9.0  MG  --  2.1  GFRNONAA 33* 43*  ANIONGAP 13 10    Recent Labs  Lab 05/17/24 0033 05/17/24 0629  PROT 6.7 6.1*  ALBUMIN 3.7 3.2*  AST 21 18  ALT 15 13  ALKPHOS 53 50  BILITOT 1.2 0.9   Lipids No results for input(s): CHOL, TRIG, HDL, LABVLDL, LDLCALC, CHOLHDL in the last 168 hours.  Hematology Recent Labs  Lab 05/16/24 2304 05/17/24 0629  WBC 9.8 8.2  RBC 4.02 3.58*  HGB 12.6 11.8*  HCT 38.4 35.8*  MCV 95.5 100.0  MCH 31.3 33.0  MCHC 32.8 33.0  RDW 13.2 13.2  PLT 171 152   Thyroid  No results for input(s): TSH, FREET4 in the last 168 hours.  BNPNo results for input(s): BNP, PROBNP in the last 168 hours.  DDimer  Recent Labs  Lab 05/16/24 2324  DDIMER 1.18*    Radiology/Studies:  CT Angio Chest PE W and/or Wo Contrast Result Date: 05/17/2024 CLINICAL DATA:  Chest pain and vomiting. PE suspected. The pain radiates to the back. EXAM: CT ANGIOGRAPHY CHEST WITH CONTRAST TECHNIQUE: Multidetector CT imaging of the chest was performed using the standard protocol during bolus administration of intravenous contrast. Multiplanar CT image reconstructions and MIPs were obtained to evaluate the vascular anatomy. RADIATION DOSE REDUCTION: This exam was performed according to the departmental dose-optimization  program which includes automated exposure control, adjustment of the mA and/or kV according to patient size and/or use of iterative reconstruction technique. CONTRAST:  60mL OMNIPAQUE  IOHEXOL  350 MG/ML SOLN COMPARISON:  05/16/2024 radiograph FINDINGS: Cardiovascular: Negative for acute pulmonary embolism. Normal caliber thoracic aorta. No pericardial effusion. Coronary artery and aortic atherosclerotic calcification. Mediastinum/Nodes: Trachea and esophagus are unremarkable. No thoracic adenopathy Lungs/Pleura: Expiratory phase exam. Bibasilar atelectasis/scarring. Otherwise no focal consolidation, pleural effusion, or pneumothorax. Upper Abdomen: No acute abnormality. Musculoskeletal: No acute fracture. Review of the MIP images confirms the above findings. IMPRESSION: Negative for acute pulmonary embolism. No acute abnormality in the chest. Electronically Signed   By: Norman Gatlin M.D.   On: 05/17/2024 03:00   DG Chest 2 View Result Date: 05/16/2024 CLINICAL DATA:  Chest pains and vomiting onset 3 p.m. today with diaphoresis. EXAM: CHEST - 2 VIEW COMPARISON:  Chest PA Lat 04/22/2012 FINDINGS: The heart has moderately enlarged since the prior study. No vascular congestion is seen or evidence of edema or pleural collection. The lungs are clear with mild elevation of the right hemidiaphragm. The mediastinum is normally outlined. There is moderate aortic atherosclerosis. Degenerative change thoracic spine. IMPRESSION: Cardiomegaly without evidence of acute chest disease. Aortic atherosclerosis. Electronically Signed   By: Francis Quam M.D.   On: 05/16/2024 23:41     Assessment and Plan: Chest pain Elevated troponins Lexiscan  06/2016 showed normal LV function,  normal wall motion, and no evidence of scar or ischemia. Echo 03/28/2023 showed EF > 55% with normal LV function, mild LVH, mild MVR and moderate TVR Presented with chest pain after getting out of bed--described as left-sided, radiating to the left  scapula, worst episode she has experienced. Pain rated 7/10, lasted ~15 minutes, now improved to 1/10. Associated symptoms included nausea, diaphoresis, dizziness, and cough. Treated with ASA 324 mg.  Tele: Sinus bradycardia, HR 50's with PACs Less concern for ischemia with TN 15> 60 > 51, EKG with no ischemic changes, and mixed features presentations.  Echo pending. Based on results, can decide if further ischemic work up necessary.    Bradycardia 72-hour Holter monitor 02/2023 showed predominantly sinus bradycardia with mean HR of 50 bpm (HR rang 37-85 bpm), frequent PACs at 4%.  Reports being complaints with Toprol XL without any lightheadedness or dizzy until yesterday episode. Last dose was 6/29 in the morning. Will discuss with MD if we should restart.   AKI CR 1.55 > 1.24 (05/07/2024: 1.17) Improved with IV fluids. Continue to monitor.   HTN This a.m. BP mildly elevated 141/63 Home med metoprolol succinate on hold with bradycardia.  Continue amlodipine  10 mg and Losartan 100 mg daily.  Continue to monitor.   Intermittent palpitations 72-hour Holter monitor 02/2023 showed predominantly sinus bradycardia with mean HR of 50 bpm (HR rang 37-85 bpm), frequent PACs at 4%.  Reports unchanged intermittent palpitations that occur at night Metoprolol succinate 25 mg on hold as above.   HLD Continue niacin Denies ever being on statin medication or any history of high cholesterol.   OSA on CPAP Compliant with CPAP   Risk Assessment/Risk Scores:          For questions or updates, please contact [facility] Please consult [email or web address] for contact info under    Signed, Lorette CINDERELLA Kapur, PA-C  05/17/2024 7:28 AM   Attending Note   Patient seen and discussed with PA Kapur, I agree with her documentation. 88 yo female history of HTN, OSA on cpap, admitted with nausea, vomiting, chest pain.   Reports she ate a [employer] chicken sandwich, shortly after laid down and  took about a 3 hour nap. When she woke while walking to the bathroom felt lightheaded and off balance. Got very nauseous and started vomiting. While vomiting she had midchest/epigastric pain, lasted about 20 minutes and resolved. She had a 2nd round of vomiting with recurrent pain that also lasted 20 minutes. Since vomiting has resolved no recurrent pain.     K 4.3 BUN 27 Cr 1.55 WBC 9.8 Hgb 12.6 Plt 171 Ddimer 1.18 Lipase 37  Trop 15-->60-->51-->42 EKG SR, chronic inferior/lateral precordial TWIs CXR: No acute process CT PE: no PE, no acute process   1.Chest pain - noncardiac chest pain. Pain occurred while vomiting few hours after eating a chicken sandwich, tender to palpation on presentation. Since vomiting has stopped her pain has stopped.  - mild trop elevation in setting of N/V, AKI, chronic LVH. Some SBPs 170s in ER. Not consistent with ACS. EKG has chronic ST/T changes. - echo with normal LVEF, no WMAs - no indication for any further cardiac workup. We will sign off inpatient care.   She can f/u with her regular cardiology team at [facility]   Dorn Ross MD

## 2024-05-17 NOTE — Discharge Instructions (Signed)
 1)Avoid ibuprofen/Advil/Aleve/Motrin/Goody Powders/Naproxen/BC powders/Meloxicam/Diclofenac/Indomethacin and other Nonsteroidal anti-inflammatory medications as these will make you more likely to bleed and can cause stomach ulcers, can also cause Kidney problems.   2)Avoid Dehydration  3)follow up with Your Cardiologist [facility] as previously scheduled-

## 2024-05-17 NOTE — Progress Notes (Incomplete)
 Attending Note   Patient seen and discussed with PA Sheron, I agree with her documentation. 88 yo female history of HTN, OSA on cpap, admitted with nausea, vomiting, chest pain.   Reports she ate a bojangles chicken sandwich, shortly after laid down and took about a 3 hour nap. When she woke while walking to the bathroom felt lightheaded and off balance. Got very nauseous and started vomiting. While vomiting she had midchest/epigastric pain, lasted about 20 minutes and resolved. She had a 2nd round of vomiting with recurrent pain that also lasted 20 minutes. Since vomiting has resolved no recurrent pain.       K 4.3 BUN 27 Cr 1.55 WBC 9.8 Hgb 12.6 Plt 171 Ddimer 1.18 Lipase 37  Trop 15-->60-->51-->42 EKG SR, chronic inferior/lateral precordial TWIs CXR: No acute process CT PE: no PE, no acute process   1.Chest pain - noncardiac chest pain. Pain occurred while vomiting, tender to palpation on presentation. Since vomiting has stopped her pain has stopped.  - mild trop elevation in setting of N/V, AKI, chronic LVH. Some SBPs 170s in ER. Not consistent with ACS. EKG has chronic ST/T changes. - echo with normal LVEF, no WMAs - no indication for any further cardiac workup. We will sign off inpatient care.   Dorn Ross MD

## 2024-05-17 NOTE — ED Provider Notes (Signed)
 [place] EMERGENCY DEPARTMENT AT [facility] Provider Note   CSN: [id number] Arrival date & time: 05/16/24  2236     Patient presents with: Chest Pain and Emesis   [name] is a 88 y.o. female.   The history is provided by the patient, the spouse and a relative.  Emesis Associated symptoms: no abdominal pain    Patient w/history of hypertension, hyperlipidemia, obesity presents with multiple complaints.  Patient reports earlier today she started having left-sided chest pain that radiates to her back with associated nausea/vomiting.  No pleuritic pain.  No significant shortness of breath.  She has become sweaty at times but she is not been using her air conditioning during the excessive heat.  She also reports lightheaded upon standing but no syncope.  She is also reports mild headache.  At this time her pain is improving.  Patient denies any abdominal pain or change in her bowel movements   Past Medical History:  Diagnosis Date   Arthritis    GERD (gastroesophageal reflux disease)    occ   Hyperlipidemia    Hypertension    Sleep apnea    cpap 3 yrs no problems use 1/2 tx    Prior to Admission medications   Medication Sig Start Date End Date Taking? Authorizing Provider  amLODipine  (NORVASC ) 10 MG tablet Take 1 tablet (10 mg total) by mouth daily. 12/13/15   Humberto Elspeth LABOR, MD  amoxicillin  (AMOXIL ) 500 MG tablet Take 2 tablets (1,000 mg total) by mouth 2 (two) times daily. 03/29/16   Claudene Rayfield HERO, MD  B Complex Vitamins (VITAMIN B COMPLEX PO) Vitamin B complex    [provider]  cephALEXin  (KEFLEX ) 500 MG capsule Take 1 capsule (500 mg total) by mouth 2 (two) times daily. 05/07/24   Dorothyann Drivers, MD  Cholecalciferol (VITAMIN D-1000 MAX ST) 25 MCG (1000 UT) tablet Take by mouth.    [provider]  citalopram (CELEXA) 10 MG tablet Take 1 tablet by mouth daily. 01/08/22 01/08/23  [provider]  Coenzyme Q10 (CO Q 10 PO)  Take 1 capsule by mouth daily. Reported on 12/13/2015    [provider]  dexamethasone (DECADRON) 4 MG tablet Take 4 mg by mouth 2 (two) times daily with a meal. Reported on 03/29/2016    [provider]  Inositol Niacinate (NIACIN FLUSH FREE PO) Niacin    [provider]  Lactobacillus (PROBIOTIC ACIDOPHILUS PO) Take 1 capsule by mouth daily.    [provider]  levothyroxine (SYNTHROID) 25 MCG tablet Take by mouth. 09/24/22 12/23/22  [provider]  losartan (COZAAR) 100 MG tablet Take 1 tablet by mouth daily. 08/22/22   [provider]  metoprolol succinate (TOPROL-XL) 25 MG 24 hr tablet Take 1 tablet by mouth daily. 02/06/23 02/06/24  [provider]  niacin (VITAMIN B3) 500 MG tablet Take by mouth.    [provider]  oxybutynin (DITROPAN) 5 MG tablet Take 5 mg by mouth 3 (three) times daily.    [provider]  potassium chloride  SA (KLOR-CON  M20) 20 MEQ tablet Take 1 tablet (20 mEq total) by mouth daily. 12/13/15   Humberto Elspeth LABOR, MD  traMADol  (ULTRAM ) 50 MG tablet Take 1 tablet (50 mg total) by mouth every 6 (six) hours as needed. 05/07/24   Dorothyann Drivers, MD  triamterene -hydrochlorothiazide (MAXZIDE-25) 37.5-25 MG tablet Take 1 tablet by mouth daily. 12/13/15   Humberto Elspeth LABOR, MD    Allergies: Other and Oysters [  shellfish allergy]    Review of Systems  Constitutional:  Positive for diaphoresis.  Respiratory:  Negative for shortness of breath.   Cardiovascular:  Positive for chest pain.  Gastrointestinal:  Positive for vomiting. Negative for abdominal pain.    Updated Vital Signs BP (!) 151/67   Pulse 62   Temp 97.8 F (36.6 C)   Resp 18   Ht 1.702 m (5' 7)   Wt 116.1 kg   SpO2 97%   BMI 40.10 kg/m   Physical Exam CONSTITUTIONAL elderly, no acute distress HEAD: Normocephalic/atraumatic EYES: EOMI/PERRL ENMT: Mucous membranes moist NECK: supple no meningeal signs SPINE/BACK:entire spine  nontender CV: S1/S2 noted, no murmurs/rubs/gallops noted LUNGS: Lungs are clear to auscultation bilaterally, no apparent distress ABDOMEN: soft, nontender NEURO: Pt is awake/alert/appropriate, moves all extremitiesx4.  No facial droop.  No arm or leg drift.  Equal strength in all extremities EXTREMITIES: pulses normal/equal, full ROM SKIN: warm, color normal PSYCH: no abnormalities of mood noted, alert and oriented to situation  (all labs ordered are listed, but only abnormal results are displayed) Labs Reviewed  BASIC METABOLIC PANEL WITH GFR - Abnormal; Notable for the following components:      Result Value   CO2 21 (*)    Glucose, Bld 169 (*)    BUN 27 (*)    Creatinine, Ser 1.55 (*)    GFR, Estimated 33 (*)    All other components within normal limits  HEPATIC FUNCTION PANEL - Abnormal; Notable for the following components:   Indirect Bilirubin 1.0 (*)    All other components within normal limits  D-DIMER, QUANTITATIVE - Abnormal; Notable for the following components:   D-Dimer, Quant 1.18 (*)    All other components within normal limits  TROPONIN I (HIGH SENSITIVITY) - Abnormal; Notable for the following components:   Troponin I (High Sensitivity) 60 (*)    All other components within normal limits  CBC  LIPASE, BLOOD  TROPONIN I (HIGH SENSITIVITY)    EKG: EKG Interpretation Date/Time:  Monday May 17 2024 01:47:24 EDT Ventricular Rate:  56 PR Interval:  211 QRS Duration:  103 QT Interval:  491 QTC Calculation: 474 R Axis:   53  Text Interpretation: Sinus rhythm Atrial premature complex RSR' in V1 or V2, right VCD or RVH Confirmed by Midge Golas ([id number]) on 05/17/2024 1:53:52 AM  Radiology: CT Angio Chest PE W and/or Wo Contrast Result Date: 05/17/2024 CLINICAL DATA:  Chest pain and vomiting. PE suspected. The pain radiates to the back. EXAM: CT ANGIOGRAPHY CHEST WITH CONTRAST TECHNIQUE: Multidetector CT imaging of the chest was performed using the standard  protocol during bolus administration of intravenous contrast. Multiplanar CT image reconstructions and MIPs were obtained to evaluate the vascular anatomy. RADIATION DOSE REDUCTION: This exam was performed according to the departmental dose-optimization program which includes automated exposure control, adjustment of the mA and/or kV according to patient size and/or use of iterative reconstruction technique. CONTRAST:  60mL OMNIPAQUE  IOHEXOL  350 MG/ML SOLN COMPARISON:  05/16/2024 radiograph FINDINGS: Cardiovascular: Negative for acute pulmonary embolism. Normal caliber thoracic aorta. No pericardial effusion. Coronary artery and aortic atherosclerotic calcification. Mediastinum/Nodes: Trachea and esophagus are unremarkable. No thoracic adenopathy Lungs/Pleura: Expiratory phase exam. Bibasilar atelectasis/scarring. Otherwise no focal consolidation, pleural effusion, or pneumothorax. Upper Abdomen: No acute abnormality. Musculoskeletal: No acute fracture. Review of the MIP images confirms the above findings. IMPRESSION: Negative for acute pulmonary embolism. No acute abnormality in the chest. Electronically Signed   By: Norman Gatlin M.D.   On:  05/17/2024 03:00   DG Chest 2 View Result Date: 05/16/2024 CLINICAL DATA:  Chest pains and vomiting onset 3 p.m. today with diaphoresis. EXAM: CHEST - 2 VIEW COMPARISON:  Chest PA Lat 04/22/2012 FINDINGS: The heart has moderately enlarged since the prior study. No vascular congestion is seen or evidence of edema or pleural collection. The lungs are clear with mild elevation of the right hemidiaphragm. The mediastinum is normally outlined. There is moderate aortic atherosclerosis. Degenerative change thoracic spine. IMPRESSION: Cardiomegaly without evidence of acute chest disease. Aortic atherosclerosis. Electronically Signed   By: Francis Quam M.D.   On: 05/16/2024 23:41     .Critical Care  Performed by: Midge Golas, MD Authorized by: Midge Golas, MD    Critical care provider statement:    Critical care time (minutes):  45   Critical care start time:  05/16/2024 11:45 PM   Critical care end time:  05/17/2024 12:30 AM   Critical care time was exclusive of:  Separately billable procedures and treating other patients   Critical care was necessary to treat or prevent imminent or life-threatening deterioration of the following conditions:  Cardiac failure   Critical care was time spent personally by me on the following activities:  Obtaining history from patient or surrogate, evaluation of patient's response to treatment, examination of patient, re-evaluation of patient's condition, pulse oximetry, ordering and review of radiographic studies, ordering and review of laboratory studies, ordering and performing treatments and interventions and discussions with consultants   I assumed direction of critical care for this patient from another provider in my specialty: no     Care discussed with: admitting provider      Medications Ordered in the ED  aspirin chewable tablet 324 mg (has no administration in time range)  sodium chloride  0.9 % bolus 1,000 mL (0 mLs Intravenous Stopped 05/17/24 0208)  iohexol  (OMNIPAQUE ) 350 MG/ML injection 60 mL (60 mLs Intravenous Contrast Given 05/17/24 0240)    Clinical Course as of 05/17/24 0409  Mon May 17, 2024  0107 Patient presents with chest pain and vomiting.  She is now feeling improved and declines pain medicines. Also reports feeling lightheaded upon standing, and family reports they have been not been using air conditioning which is likely contribute to her becoming diaphoretic.  Labs reveal some dehydration. [DW]  0146 Troponin is mildly elevated on repeat.  Will expand workup, repeat EKG [DW]  0153 Repeat EKG has been performed.  Patient has ST depression on EKG, though this has been seen in previous EKGs from several years ago. Labs are pending then we will likely consult cardiology [DW]  [phone] Patient stable  at this time.  Workup for PE was unremarkable  Discussed with Dr. Winfred with cardiology.  He has reviewed EKG and labs.  Most likely EKG changes consistent with LVH.  He would recommend continuing cardiac monitoring, echocardiogram and no other acute intervention.  Patient will be admitted to [facility] [DW]  9590 Discussed with Dr. Adefeso for admission [DW]    Clinical Course User Index [DW] Midge Golas, MD              HEART Score: 6                    Medical Decision Making Amount and/or Complexity of Data Reviewed Labs: ordered. Radiology: ordered. ECG/medicine tests: ordered.  Risk OTC drugs. Prescription drug management. Decision regarding hospitalization.   This patient presents to the ED for concern of chest pain, this  involves an extensive number of treatment options, and is a complaint that carries with it a high risk of complications and morbidity.  The differential diagnosis includes but is not limited to acute coronary syndrome, aortic dissection, pulmonary embolism, pericarditis, pneumothorax, pneumonia, myocarditis, pleurisy, esophageal rupture, acute abdomen   Comorbidities that complicate the patient evaluation: Patient's presentation is complicated by their history of hypertension  Social Determinants of Health: Patient has good family support at bedside   Additional history obtained: Additional history obtained from family and spouse Records reviewed Care Everywhere/External Records  Lab Tests: I Ordered, and personally interpreted labs.  The pertinent results include: Acute kidney injury  Imaging Studies ordered: I ordered imaging studies including X-ray chest  I independently visualized and interpreted imaging which showed no acute findings I agree with the radiologist interpretation  Cardiac Monitoring: The patient was maintained on a cardiac monitor.  I personally viewed and interpreted the cardiac monitor which showed an underlying  rhythm of:  sinus rhythm  Medicines ordered and prescription drug management: I ordered medication including IV fluids for probable dehydration Reevaluation of the patient after these medicines showed that the patient    stayed the same   Critical Interventions:   admission and monitoring for chest pain  Consultations Obtained: I requested consultation with the admitting physician [facility] and consultant cardiology, and discussed  findings as well as pertinent plan - they recommend: Admit  Reevaluation: After the interventions noted above, I reevaluated the patient and found that they have :stayed the same  Complexity of problems addressed: Patient's presentation is most consistent with  acute presentation with potential threat to life or bodily function  Disposition: After consideration of the diagnostic results and the patient's response to treatment,  I feel that the patent would benefit from admission  .    Patient presents with chest pain with chronic EKG findings and mildly elevated troponin.  Patient's pain has not escalated in the ER.  Due to her age and underlying health conditions, patient will be admitted     Final diagnoses:  Precordial pain  Dehydration    ED Discharge Orders     None          Midge Golas, MD 05/17/24 [phone]

## 2024-05-17 NOTE — Progress Notes (Signed)
  Transition of Care Department [facility]) has reviewed patient and no other TOC needs have been identified at this time. We will continue to monitor patient advancement through interdisciplinary progression rounds. If new patient transition needs arise, please place a TOC consult.    05/17/24 1054  TOC Brief Assessment  Insurance and Status Reviewed  Patient has primary care physician Yes  Home environment has been reviewed Lives with husband.  Prior level of function: Some assist.  Prior/Current Home Services No current home services  Social Drivers of Health Review SDOH reviewed no interventions necessary  Readmission risk has been reviewed Yes  Transition of care needs no transition of care needs at this time

## 2024-05-17 NOTE — ED Notes (Signed)
 Pt felt light headed and dizzy while standing during orthostatics vitals.

## 2024-06-08 ENCOUNTER — Other Ambulatory Visit (HOSPITAL_COMMUNITY): Payer: Self-pay | Admitting: Internal Medicine

## 2024-06-08 DIAGNOSIS — Z1231 Encounter for screening mammogram for malignant neoplasm of breast: Secondary | ICD-10-CM

## 2024-07-07 ENCOUNTER — Ambulatory Visit (HOSPITAL_COMMUNITY)
Admission: RE | Admit: 2024-07-07 | Discharge: 2024-07-07 | Disposition: A | Discharge status: Home / Self Care | Source: Ambulatory Visit | Attending: Internal Medicine | Admitting: Internal Medicine

## 2024-07-07 DIAGNOSIS — Z1231 Encounter for screening mammogram for malignant neoplasm of breast: Secondary | ICD-10-CM | POA: Insufficient documentation

## 2024-07-11 ENCOUNTER — Encounter (HOSPITAL_COMMUNITY): Payer: Self-pay | Admitting: Emergency Medicine

## 2024-07-11 ENCOUNTER — Emergency Department (HOSPITAL_COMMUNITY)

## 2024-07-11 ENCOUNTER — Other Ambulatory Visit: Payer: Self-pay

## 2024-07-11 ENCOUNTER — Emergency Department (HOSPITAL_COMMUNITY)
Admission: EM | Admit: 2024-07-11 | Discharge: 2024-07-11 | Disposition: A | Discharge status: Home / Self Care | Attending: Emergency Medicine | Admitting: Emergency Medicine

## 2024-07-11 DIAGNOSIS — Z87891 Personal history of nicotine dependence: Secondary | ICD-10-CM | POA: Insufficient documentation

## 2024-07-11 DIAGNOSIS — W01198A Fall on same level from slipping, tripping and stumbling with subsequent striking against other object, initial encounter: Secondary | ICD-10-CM | POA: Diagnosis not present

## 2024-07-11 DIAGNOSIS — I10 Essential (primary) hypertension: Secondary | ICD-10-CM | POA: Insufficient documentation

## 2024-07-11 DIAGNOSIS — S52532A Colles' fracture of left radius, initial encounter for closed fracture: Secondary | ICD-10-CM | POA: Insufficient documentation

## 2024-07-11 DIAGNOSIS — M25532 Pain in left wrist: Secondary | ICD-10-CM | POA: Diagnosis present

## 2024-07-11 MED ORDER — LIDOCAINE HCL (PF) 1 % IJ SOLN
30.0000 mL | Freq: Once | INTRAMUSCULAR | Status: AC
  Administered 2024-07-11: 30 mL
  Filled 2024-07-11: qty 30

## 2024-07-11 MED ORDER — ACETAMINOPHEN 500 MG PO TABS
1000.0000 mg | ORAL_TABLET | Freq: Once | ORAL | Status: AC
  Administered 2024-07-11: 1000 mg via ORAL
  Filled 2024-07-11: qty 2

## 2024-07-11 NOTE — Discharge Instructions (Addendum)
 You were evaluated in the Emergency Department and after careful evaluation, we did not find any emergent condition requiring admission or further testing in the hospital.  Your exam/testing today is overall reassuring.  Symptoms due to a broken bone in your wrist.  Recommend follow-up with the orthopedic specialist.  Use your home tramadol  or Tylenol  for pain.  Please return to the Emergency Department if you experience any worsening of your condition.   Thank you for allowing us  to be a part of your care.

## 2024-07-11 NOTE — ED Triage Notes (Signed)
 Pt states she tripped on her way to bed and fell hitting L wrist against a table. Denies LOC. Pt c/o pain to L wrist.

## 2024-07-11 NOTE — ED Provider Notes (Signed)
 [facility]Y DEPT [facility] Emergency Department Provider Note MRN:  [id number]  Arrival date & time: 07/11/24     Chief Complaint   Fall   History of Present Illness   [name] is a 88 y.o. year-old female with a history of hypertension presenting to the ED with chief complaint of fall.  Tripped and fell on the way to the bathroom.  Pain and swelling to the left wrist.  Denies head trauma, no loss of consciousness, no neck or back pain, no chest pain or shortness of breath, no abdominal pain, no other complaints.  Review of Systems  A thorough review of systems was obtained and all systems are negative except as noted in the HPI and PMH.   Patient's Health History    Past Medical History:  Diagnosis Date   Arthritis    GERD (gastroesophageal reflux disease)    occ   Hyperlipidemia    Hypertension    Sleep apnea    cpap 3 yrs no problems use 1/2 tx    Past Surgical History:  Procedure Laterality Date   ABDOMINAL HYSTERECTOMY  07/31/10   CHOLECYSTECTOMY  04/24/2012   Procedure: LAPAROSCOPIC CHOLECYSTECTOMY;  Surgeon: Vicenta DELENA Poli, MD;  Location: [facility] OR;  Service: General;  Laterality: N/A;   EYE SURGERY     bil   TONSILLECTOMY     TOTAL KNEE ARTHROPLASTY  01/23/2010/ 02/21/11   bil    Family History  Problem Relation Age of Onset   Prostate cancer Father     Social History   Socioeconomic History   Marital status: Married    Spouse name: Not on file   Number of children: Not on file   Years of education: Not on file   Highest education level: Not on file  Occupational History   Not on file  Tobacco Use   Smoking status: Never   Smokeless tobacco: Former    Types: Snuff  Vaping Use   Vaping status: Never Used  Substance and Sexual Activity   Alcohol use: No   Drug use: No   Sexual activity: Yes    Birth control/protection: Post-menopausal  Other Topics Concern   Not on file  Social History Narrative   Not on file   Social Drivers  of Health   Financial Resource Strain: Low Risk  (04/14/2024)   Received from [facility]   Overall Financial Resource Strain (CARDIA)    Difficulty of Paying Living Expenses: Not hard at all  Food Insecurity: No Food Insecurity (05/17/2024)   Hunger Vital Sign    Worried About Running Out of Food in the Last Year: Never true    Ran Out of Food in the Last Year: Never true  Transportation Needs: No Transportation Needs (05/17/2024)   PRAPARE - Administrator, Civil Service (Medical): No    Lack of Transportation (Non-Medical): No  Physical Activity: Not on file  Stress: Not on file  Social Connections: Moderately Integrated (05/17/2024)   Social Connection and Isolation Panel    Frequency of Communication with Friends and Family: More than three times a week    Frequency of Social Gatherings with Friends and Family: More than three times a week    Attends Religious Services: More than 4 times per year    Active Member of [employer] or Organizations: No    Attends Banker Meetings: Never    Marital Status: Married  Catering manager Violence: Not At Risk (05/17/2024)  Humiliation, Afraid, Rape, and Kick questionnaire    Fear of Current or Ex-Partner: No    Emotionally Abused: No    Physically Abused: No    Sexually Abused: No     Physical Exam   Vitals:   07/11/24 0143 07/11/24 0400  BP: (!) 119/51 133/60  Pulse: (!) 55 (!) 58  Resp: 18 17  Temp: 97.6 F (36.4 C)   SpO2: 95% 95%    CONSTITUTIONAL: Well-appearing, NAD NEURO/PSYCH:  Alert and oriented x 3, no focal deficits EYES:  eyes equal and reactive ENT/NECK:  no LAD, no JVD CARDIO: Regular rate, well-perfused, normal S1 and S2 PULM:  CTAB no wheezing or rhonchi GI/GU:  non-distended, non-tender MSK/SPINE:  No gross deformities, no edema SKIN:  no rash, atraumatic   *Additional and/or pertinent findings included in MDM below  Diagnostic and Interventional Summary    EKG  Interpretation Date/Time:    Ventricular Rate:    PR Interval:    QRS Duration:    QT Interval:    QTC Calculation:   R Axis:      Text Interpretation:         Labs Reviewed - No data to display  DG Wrist Complete Left  Final Result    DG Wrist Complete Left  Final Result      Medications  acetaminophen  (TYLENOL ) tablet 1,000 mg (1,000 mg Oral Given 07/11/24 0209)  lidocaine  (PF) (XYLOCAINE ) 1 % injection 30 mL (30 mLs Infiltration Given by Other 07/11/24 0241)     Procedures  /  Critical Care .Nerve Block  Date/Time: 07/11/2024 3:39 AM  Performed by: Theadore Ozell HERO, MD Authorized by: Theadore Ozell HERO, MD   Consent:    Consent obtained:  Verbal   Consent given by:  Patient   Risks, benefits, and alternatives were discussed: yes     Risks discussed:  Allergic reaction, bleeding, intravenous injection, infection, nerve damage, pain, unsuccessful block and swelling Universal protocol:    Procedure explained and questions answered to patient or proxy's satisfaction: yes     Immediately prior to procedure, a time out was called: yes     Patient identity confirmed:  Verbally with patient Indications:    Indications:  Procedural anesthesia Location:    Body area:  Upper extremity   Upper extremity nerve:  Radial   Laterality:  Left Pre-procedure details:    Skin preparation:  Alcohol   Preparation: Patient was prepped and draped in usual sterile fashion   Procedure details:    Block needle gauge:  25 G   Anesthetic injected:  Lidocaine  1% w/o epi   Injection procedure:  Anatomic landmarks identified, anatomic landmarks palpated and incremental injection   Paresthesia:  None Post-procedure details:    Dressing:  Sterile dressing   Outcome:  Anesthesia achieved   Procedure completion:  Tolerated well, no immediate complications Comments:     Hematoma block performed for left Colles' fracture .Reduction of fracture  Date/Time: 07/11/2024 3:41 AM  Performed by:  Theadore Ozell HERO, MD Authorized by: Theadore Ozell HERO, MD  Consent: Verbal consent obtained Risks and benefits: risks, benefits and alternatives were discussed Consent given by: patient Patient understanding: patient states understanding of the procedure being performed Imaging studies: imaging studies available Patient identity confirmed: verbally with patient Time out: Immediately prior to procedure a time out was called to verify the correct patient, procedure, equipment, support staff and site/side marked as required. Local anesthesia used: yes Anesthesia: hematoma block  Anesthesia: Local  anesthesia used: yes Local Anesthetic: lidocaine  1% without epinephrine  Anesthetic total: 10 mL  Sedation: Patient sedated: no  Patient tolerance: patient tolerated the procedure well with no immediate complications Comments: Reduction of fracture performed with finger traps   .Splint Application  Date/Time: 07/11/2024 3:42 AM  Performed by: Theadore Ozell HERO, MD Authorized by: Theadore Ozell HERO, MD   Consent:    Consent obtained:  Verbal   Consent given by:  Patient   Risks, benefits, and alternatives were discussed: yes   Universal protocol:    Procedure explained and questions answered to patient or proxy's satisfaction: yes     Immediately prior to procedure a time out was called: yes     Patient identity confirmed:  Verbally with patient Pre-procedure details:    Distal neurologic exam:  Normal   Distal perfusion: distal pulses strong   Procedure details:    Location:  Wrist   Wrist location:  L wrist   Splint type:  Sugar tong   Supplies:  Fiberglass   Attestation: Splint applied and adjusted personally by me   Post-procedure details:    Distal neurologic exam:  Normal   Distal perfusion: distal pulses strong     Procedure completion:  Tolerated well, no immediate complications   Post-procedure imaging: reviewed     ED Course and Medical Decision Making  Initial Impression  and Ddx Swelling and tenderness to the left wrist, neurovascular intact, awaiting x-ray.  Past medical/surgical history that increases complexity of ED encounter: None  Interpretation of Diagnostics I personally reviewed the wrist x-ray and my interpretation is as follows: Colles' fracture with dorsal angulation    Patient Reassessment and Ultimate Disposition/Management     Patient tolerated hematoma block and finger traps very well, postreduction films showing improvement, neurovascularly intact post reduction and splint, appropriate for discharge.  Patient management required discussion with the following services or consulting groups:  None  Complexity of Problems Addressed Acute illness or injury that poses threat of life of bodily function  Additional Data Reviewed and Analyzed Further history obtained from: Further history from spouse/family member  Additional Factors Impacting ED Encounter Risk Major procedures  Ozell HERO. Theadore, MD [facility] Emergency Medicine [facility] [email or web address]  Final Clinical Impressions(s) / ED Diagnoses     ICD-10-CM   1. Closed Colles' fracture of left radius, initial encounter  S52.532A       ED Discharge Orders     None        Discharge Instructions Discussed with and Provided to Patient:     Discharge Instructions      You were evaluated in the Emergency Department and after careful evaluation, we did not find any emergent condition requiring admission or further testing in the hospital.  Your exam/testing today is overall reassuring.  Symptoms due to a broken bone in your wrist.  Recommend follow-up with the orthopedic specialist.  Use your home tramadol  or Tylenol  for pain.  Please return to the Emergency Department if you experience any worsening of your condition.   Thank you for allowing us  to be a part of your care.       Theadore Ozell HERO, MD 07/11/24 [phone]
